# Patient Record
Sex: Female | Born: 1955
Health system: Southern US, Community
[De-identification: ages and names within clinical notes are randomized; demographics above are authoritative.]

## PROBLEM LIST (undated history)

## (undated) DIAGNOSIS — N952 Postmenopausal atrophic vaginitis: Secondary | ICD-10-CM

## (undated) DIAGNOSIS — M810 Age-related osteoporosis without current pathological fracture: Secondary | ICD-10-CM

## (undated) DIAGNOSIS — I10 Essential (primary) hypertension: Secondary | ICD-10-CM

## (undated) DIAGNOSIS — E559 Vitamin D deficiency, unspecified: Secondary | ICD-10-CM

## (undated) HISTORY — DX: Age-related osteoporosis without current pathological fracture: M81.0

## (undated) HISTORY — DX: Essential (primary) hypertension: I10

## (undated) HISTORY — PX: WISDOM TOOTH EXTRACTION: SHX21

## (undated) HISTORY — DX: Vitamin D deficiency, unspecified: E55.9

## (undated) HISTORY — DX: Postmenopausal atrophic vaginitis: N95.2

---

## 2000-05-27 ENCOUNTER — Other Ambulatory Visit: Admission: RE | Admit: 2000-05-27 | Discharge: 2000-05-27 | Payer: Self-pay | Admitting: Internal Medicine

## 2003-10-16 ENCOUNTER — Other Ambulatory Visit: Admission: RE | Admit: 2003-10-16 | Discharge: 2003-10-16 | Payer: Self-pay | Admitting: Family Medicine

## 2003-10-26 ENCOUNTER — Encounter: Admission: RE | Admit: 2003-10-26 | Discharge: 2003-10-26 | Payer: Self-pay | Admitting: Family Medicine

## 2003-10-26 ENCOUNTER — Ambulatory Visit (HOSPITAL_COMMUNITY): Admission: RE | Admit: 2003-10-26 | Discharge: 2003-10-26 | Payer: Self-pay | Admitting: Family Medicine

## 2004-10-14 ENCOUNTER — Ambulatory Visit: Payer: Self-pay | Admitting: Family Medicine

## 2004-10-28 ENCOUNTER — Ambulatory Visit: Payer: Self-pay | Admitting: Family Medicine

## 2004-11-07 ENCOUNTER — Ambulatory Visit: Payer: Self-pay | Admitting: Family Medicine

## 2004-11-29 ENCOUNTER — Encounter: Admission: RE | Admit: 2004-11-29 | Discharge: 2004-11-29 | Payer: Self-pay | Admitting: Family Medicine

## 2005-11-04 ENCOUNTER — Other Ambulatory Visit: Admission: RE | Admit: 2005-11-04 | Discharge: 2005-11-04 | Payer: Self-pay | Admitting: Obstetrics & Gynecology

## 2006-01-13 ENCOUNTER — Ambulatory Visit (HOSPITAL_COMMUNITY): Admission: RE | Admit: 2006-01-13 | Discharge: 2006-01-13 | Payer: Self-pay | Admitting: Internal Medicine

## 2007-01-22 ENCOUNTER — Ambulatory Visit (HOSPITAL_COMMUNITY): Admission: RE | Admit: 2007-01-22 | Discharge: 2007-01-22 | Payer: Self-pay | Admitting: Internal Medicine

## 2007-03-01 ENCOUNTER — Other Ambulatory Visit: Admission: RE | Admit: 2007-03-01 | Discharge: 2007-03-01 | Payer: Self-pay | Admitting: Obstetrics & Gynecology

## 2008-01-24 ENCOUNTER — Ambulatory Visit (HOSPITAL_COMMUNITY): Admission: RE | Admit: 2008-01-24 | Discharge: 2008-01-24 | Payer: Self-pay | Admitting: Internal Medicine

## 2008-02-03 ENCOUNTER — Encounter: Admission: RE | Admit: 2008-02-03 | Discharge: 2008-02-03 | Payer: Self-pay | Admitting: Internal Medicine

## 2008-04-07 ENCOUNTER — Other Ambulatory Visit: Admission: RE | Admit: 2008-04-07 | Discharge: 2008-04-07 | Payer: Self-pay | Admitting: Obstetrics and Gynecology

## 2008-05-02 ENCOUNTER — Other Ambulatory Visit: Admission: RE | Admit: 2008-05-02 | Discharge: 2008-05-02 | Payer: Self-pay | Admitting: Obstetrics & Gynecology

## 2009-02-07 ENCOUNTER — Ambulatory Visit (HOSPITAL_COMMUNITY): Admission: RE | Admit: 2009-02-07 | Discharge: 2009-02-07 | Payer: Self-pay | Admitting: Internal Medicine

## 2010-02-21 ENCOUNTER — Ambulatory Visit (HOSPITAL_COMMUNITY): Admission: RE | Admit: 2010-02-21 | Discharge: 2010-02-21 | Payer: Self-pay | Admitting: Internal Medicine

## 2010-07-15 LAB — HM COLONOSCOPY

## 2010-09-25 ENCOUNTER — Emergency Department (HOSPITAL_COMMUNITY)
Admission: EM | Admit: 2010-09-25 | Discharge: 2010-09-25 | Disposition: A | Discharge status: Home / Self Care | Payer: BC Managed Care – PPO | Attending: Emergency Medicine | Admitting: Emergency Medicine

## 2010-09-25 ENCOUNTER — Emergency Department (HOSPITAL_COMMUNITY): Payer: BC Managed Care – PPO

## 2010-09-25 DIAGNOSIS — R0789 Other chest pain: Secondary | ICD-10-CM | POA: Insufficient documentation

## 2010-09-25 DIAGNOSIS — I10 Essential (primary) hypertension: Secondary | ICD-10-CM | POA: Insufficient documentation

## 2010-09-25 DIAGNOSIS — M79609 Pain in unspecified limb: Secondary | ICD-10-CM | POA: Insufficient documentation

## 2010-09-25 LAB — DIFFERENTIAL
Basophils Absolute: 0 10*3/uL (ref 0.0–0.1)
Basophils Relative: 1 % (ref 0–1)
Eosinophils Absolute: 0.2 10*3/uL (ref 0.0–0.7)
Monocytes Absolute: 0.4 10*3/uL (ref 0.1–1.0)
Monocytes Relative: 9 % (ref 3–12)
Neutro Abs: 2.3 10*3/uL (ref 1.7–7.7)

## 2010-09-25 LAB — URINE MICROSCOPIC-ADD ON

## 2010-09-25 LAB — URINALYSIS, ROUTINE W REFLEX MICROSCOPIC
Ketones, ur: NEGATIVE mg/dL
Nitrite: NEGATIVE
Specific Gravity, Urine: 1.013 (ref 1.005–1.030)
Urobilinogen, UA: 0.2 mg/dL (ref 0.0–1.0)
pH: 8 (ref 5.0–8.0)

## 2010-09-25 LAB — POCT I-STAT, CHEM 8
Chloride: 103 mEq/L (ref 96–112)
Glucose, Bld: 94 mg/dL (ref 70–99)
HCT: 38 % (ref 36.0–46.0)
Hemoglobin: 12.9 g/dL (ref 12.0–15.0)
Potassium: 3.6 mEq/L (ref 3.5–5.1)

## 2010-09-25 LAB — POCT CARDIAC MARKERS
CKMB, poc: <1 ng/mL — ABNORMAL LOW (ref 1.0–8.0)
Troponin i, poc: <0.05 ng/mL (ref 0.00–0.09)

## 2010-09-25 LAB — CBC
MCH: 26 pg (ref 26.0–34.0)
MCHC: 32.4 g/dL (ref 30.0–36.0)
RBC: 4.61 MIL/uL (ref 3.87–5.11)
RDW: 13 % (ref 11.5–15.5)

## 2010-09-25 LAB — D-DIMER, QUANTITATIVE: D-Dimer, Quant: <0.22 ug/mL-FEU (ref 0.00–0.48)

## 2010-09-26 LAB — URINE CULTURE
Colony Count: NO GROWTH
Culture  Setup Time: 201203211652

## 2011-01-30 ENCOUNTER — Other Ambulatory Visit (HOSPITAL_COMMUNITY): Payer: Self-pay | Admitting: Internal Medicine

## 2011-01-30 DIAGNOSIS — Z1231 Encounter for screening mammogram for malignant neoplasm of breast: Secondary | ICD-10-CM

## 2011-03-04 ENCOUNTER — Ambulatory Visit (HOSPITAL_COMMUNITY)
Admission: RE | Admit: 2011-03-04 | Discharge: 2011-03-04 | Disposition: A | Discharge status: Home / Self Care | Payer: BC Managed Care – PPO | Source: Ambulatory Visit | Attending: Internal Medicine | Admitting: Internal Medicine

## 2011-03-04 DIAGNOSIS — Z1231 Encounter for screening mammogram for malignant neoplasm of breast: Secondary | ICD-10-CM

## 2012-03-04 ENCOUNTER — Other Ambulatory Visit (HOSPITAL_BASED_OUTPATIENT_CLINIC_OR_DEPARTMENT_OTHER): Payer: Self-pay | Admitting: Internal Medicine

## 2012-03-04 DIAGNOSIS — Z1231 Encounter for screening mammogram for malignant neoplasm of breast: Secondary | ICD-10-CM

## 2012-03-15 ENCOUNTER — Ambulatory Visit (HOSPITAL_BASED_OUTPATIENT_CLINIC_OR_DEPARTMENT_OTHER)
Admission: RE | Admit: 2012-03-15 | Discharge: 2012-03-15 | Disposition: A | Discharge status: Home / Self Care | Payer: BC Managed Care – PPO | Source: Ambulatory Visit | Attending: Internal Medicine | Admitting: Internal Medicine

## 2012-03-15 DIAGNOSIS — Z1231 Encounter for screening mammogram for malignant neoplasm of breast: Secondary | ICD-10-CM | POA: Insufficient documentation

## 2012-07-14 LAB — HM PAP SMEAR: HM PAP: NEGATIVE

## 2013-03-04 ENCOUNTER — Other Ambulatory Visit (HOSPITAL_BASED_OUTPATIENT_CLINIC_OR_DEPARTMENT_OTHER): Payer: Self-pay | Admitting: Internal Medicine

## 2013-03-04 DIAGNOSIS — Z1231 Encounter for screening mammogram for malignant neoplasm of breast: Secondary | ICD-10-CM

## 2013-03-16 ENCOUNTER — Ambulatory Visit (HOSPITAL_BASED_OUTPATIENT_CLINIC_OR_DEPARTMENT_OTHER)
Admission: RE | Admit: 2013-03-16 | Discharge: 2013-03-16 | Disposition: A | Discharge status: Home / Self Care | Payer: BC Managed Care – PPO | Source: Ambulatory Visit | Attending: Internal Medicine | Admitting: Internal Medicine

## 2013-03-16 DIAGNOSIS — Z1231 Encounter for screening mammogram for malignant neoplasm of breast: Secondary | ICD-10-CM | POA: Insufficient documentation

## 2013-08-01 ENCOUNTER — Encounter: Payer: Self-pay | Admitting: Nurse Practitioner

## 2013-08-02 ENCOUNTER — Encounter: Payer: Self-pay | Admitting: Nurse Practitioner

## 2013-08-02 ENCOUNTER — Ambulatory Visit (INDEPENDENT_AMBULATORY_CARE_PROVIDER_SITE_OTHER): Payer: BC Managed Care – PPO | Admitting: Nurse Practitioner

## 2013-08-02 VITALS — BP 102/64 | HR 64 | Ht 63.25 in | Wt 107.0 lb

## 2013-08-02 DIAGNOSIS — Z01419 Encounter for gynecological examination (general) (routine) without abnormal findings: Secondary | ICD-10-CM

## 2013-08-02 MED ORDER — ESTRADIOL 10 MCG VA TABS: 1.0000 | ORAL_TABLET | Freq: Every day | VAGINAL | Status: DC

## 2013-08-02 MED ORDER — ERGOCALCIFEROL 1.25 MG (50000 UT) PO CAPS: 50000.0000 [IU] | ORAL_CAPSULE | ORAL | Status: DC

## 2013-08-02 NOTE — Patient Instructions (Signed)

## 2013-08-02 NOTE — Progress Notes (Signed)
Patient ID: Sonya Joyce, female   DOB: 11/10/1955, 58 y.o.   MRN: [id number] 58 y.o. G0P0 Married Caucasian Fe here for annual exam.  No vaso symptoms. No vaginal symptoms and has good bladder control.  No LMP recorded. Patient is postmenopausal.          Sexually active: no  The current method of family planning is none.    Exercising: yes  Gym/ health club routine includes mod to heavy weightlifting and elliptical 6 days per week. Smoker:  no  Health Maintenance: Pap:  07/14/12, WNL, neg HR HPV MMG:  03/16/13, Bi-Rads 1: negative Colonoscopy:  07/15/10, chronic active colitis, tubular adenoma, recheck 5 years BMD:  2013, repeat scheduled for 09/2013, PCP follows TDaP:  03/01/07 Shingles 07/29/13 Labs:  PCP Vit D last week results were 59, takes Vit D every other week   reports that she has never smoked. She has never used smokeless tobacco. She reports that she drinks about 1.5 ounces of alcohol per week. She reports that she does not use illicit drugs.  Past Medical History  Diagnosis Date  . Hypertension   . Vitamin D deficiency   . Atrophic vaginitis     Past Surgical History  Procedure Laterality Date  . Wisdom tooth extraction  age 58    Current Outpatient Prescriptions  Medication Sig Dispense Refill  . ALPRAZolam (XANAX) 0.5 MG tablet Take 1 tablet by mouth as needed.      . Calcium Carbonate-Vit D-Min (CALCIUM 1200 PO) Take 1,200 mg by mouth daily.      . ergocalciferol (VITAMIN D2) 50000 UNITS capsule Take 1 capsule (50,000 Units total) by mouth once a week.  30 capsule  3  . Estradiol (VAGIFEM) 10 MCG TABS vaginal tablet Place 1 tablet (10 mcg total) vaginally daily.  24 tablet  3  . losartan-hydrochlorothiazide (HYZAAR) 100-25 MG per tablet Take 1 tablet by mouth daily.       No current facility-administered medications for this visit.    Family History  Problem Relation Age of Onset  . Hepatitis C Mother   . Hypertension Mother   . Prostate cancer Father 265  .  Hypertension Father   . Multiple births Brother     has a set of twins  . Hypertension Brother     ROS:  Pertinent items are noted in HPI.  Otherwise, a comprehensive ROS was negative.  Exam:   BP 102/64  Pulse 64  Ht 5' 3.25" (1.607 m)  Wt 107 lb (48.535 kg)  BMI 18.79 kg/m2 Height: 5' 3.25" (160.7 cm)  Ht Readings from Last 3 Encounters:  08/02/13 5' 3.25" (1.607 m)    General appearance: alert, cooperative and appears stated age Head: Normocephalic, without obvious abnormality, atraumatic Neck: no adenopathy, supple, symmetrical, trachea midline and thyroid normal to inspection and palpation Lungs: clear to auscultation bilaterally Breasts: normal appearance, no masses or tenderness Heart: regular rate and rhythm Abdomen: soft, non-tender; no masses,  no organomegaly Extremities: extremities normal, atraumatic, no cyanosis or edema Skin: Skin color, texture, turgor normal. No rashes or lesions Lymph nodes: Cervical, supraclavicular, and axillary nodes normal. No abnormal inguinal nodes palpated Neurologic: Grossly normal   Pelvic: External genitalia:  no lesions              Urethra:  normal appearing urethra with no masses, tenderness or lesions              Bartholin's and Skene's: normal  Vagina: normal appearing vagina with normal color and discharge, no lesions              Cervix: anteverted              Pap taken: no Bimanual Exam:  Uterus:  normal size, contour, position, consistency, mobility, non-tender              Adnexa: no mass, fullness, tenderness               Rectovaginal: Confirms               Anus:  normal sphincter tone, no lesions  A:  Well Woman with normal exam  Postmenopausal  Atrophic Vaginitis  P:   Pap smear as per guidelines   Mammogram due 9/15  Refill on Vit D and Vagifem for a year  Counseled with risk of DVT, CVA, cancer, etc  Counseled on breast self exam, mammography screening, adequate intake of calcium and  vitamin D, diet and exercise, Kegel's exercises return annually or prn  An After Visit Summary was printed and given to the patient.

## 2013-08-03 NOTE — Progress Notes (Signed)
Reviewed personally.  M. Suzanne Avid Guillette, MD.  

## 2013-08-04 ENCOUNTER — Other Ambulatory Visit: Payer: Self-pay | Admitting: *Deleted

## 2013-08-04 ENCOUNTER — Ambulatory Visit: Payer: Self-pay | Admitting: Nurse Practitioner

## 2013-08-04 MED ORDER — ESTRADIOL 10 MCG VA TABS: 1.0000 | ORAL_TABLET | Freq: Every day | VAGINAL | Status: DC

## 2013-08-04 NOTE — Telephone Encounter (Signed)
Incoming fax requesting 90 day supply for Vagifem.   Rx sent for 90 days and 3 refills - Per Ms Patty's note 08/02/2013.

## 2013-08-05 ENCOUNTER — Telehealth: Payer: Self-pay | Admitting: Nurse Practitioner

## 2013-08-05 MED ORDER — ESTRADIOL 10 MCG VA TABS: ORAL_TABLET | VAGINAL | Status: DC

## 2013-08-05 NOTE — Telephone Encounter (Signed)
Need to verify directions on  Estradiol (VAGIFEM) 10 MCG TABS vaginal tablet  Place 1 tablet (10 mcg total) vaginally daily., Starting 08/04/2013, Until Discontinued, Normal, Last Dose: Not Recorded  Refills: 3 ordered Pharmacy: [facility] - [place], [place] - [address]

## 2013-08-05 NOTE — Telephone Encounter (Signed)
Pharmacy calling to clarify orders for Vagifem. Advised 1 tablet 10 mcg 2 times per week. Dispense 24 with 3 refills, 90 day supply.  Confirmed with Lauro FranklinPatricia Rolen-Grubb, FNP, correct.  Routing to provider for final review. Patient agreeable to disposition. Will close encounter

## 2013-10-15 ENCOUNTER — Ambulatory Visit: Payer: BC Managed Care – PPO | Admitting: Family Medicine

## 2013-10-15 VITALS — BP 132/82 | HR 67 | Temp 98.1°F | Resp 18 | Ht 62.5 in | Wt 102.0 lb

## 2013-10-15 DIAGNOSIS — R300 Dysuria: Secondary | ICD-10-CM

## 2013-10-15 DIAGNOSIS — R3129 Other microscopic hematuria: Secondary | ICD-10-CM

## 2013-10-15 DIAGNOSIS — N390 Urinary tract infection, site not specified: Secondary | ICD-10-CM

## 2013-10-15 LAB — POCT URINALYSIS DIPSTICK
Bilirubin, UA: NEGATIVE
GLUCOSE UA: NEGATIVE
Ketones, UA: 40
NITRITE UA: NEGATIVE
Protein, UA: 30
UROBILINOGEN UA: 0.2
pH, UA: 5.5

## 2013-10-15 LAB — POCT UA - MICROSCOPIC ONLY
Casts, Ur, LPF, POC: NEGATIVE
Crystals, Ur, HPF, POC: NEGATIVE
Epithelial cells, urine per micros: NEGATIVE
Mucus, UA: POSITIVE
Yeast, UA: NEGATIVE

## 2013-10-15 MED ORDER — CEPHALEXIN 500 MG PO CAPS: 500.0000 mg | ORAL_CAPSULE | Freq: Two times a day (BID) | ORAL | Status: DC

## 2013-10-15 NOTE — Patient Instructions (Signed)
Let me know if you are not feeling better in the next day or tow.  Use the keflex as directed for UTI.  I will send your urine for a culture and let you know if there is any problem.  You can use OTC pyrudium (azo is one brand name) as needed for symptoms.

## 2013-10-15 NOTE — Progress Notes (Addendum)
[facility] [address], [place] [address] [phone]  Date:  10/15/2013   Name:  Sonya Joyce   DOB:  01/10/1956   MRN:  [id number]  PCP:  Martha ClanShaw, William, MD    Chief Complaint: Urinary Tract Infection   History of Present Illness:  Sonya Joyce is a 58 y.o. very pleasant female patient who presents with the following:  Here today with likely UTI.  History of HTN but otherwises generally healthy.  She is allergic to sulfa, post- menopausal  She has had a UTI in the past- her sx started just about an hour ago.   She has noted urinary frequency, dysuria. No blood in her uriene.  No abdominal pain, no nausea, vomiting or fever, no vaginal sx.    There are no active problems to display for this patient.   Past Medical History  Diagnosis Date  . Hypertension   . Vitamin D deficiency   . Atrophic vaginitis     Past Surgical History  Procedure Laterality Date  . Wisdom tooth extraction  age 89    History  Substance Use Topics  . Smoking status: Never Smoker   . Smokeless tobacco: Never Used  . Alcohol Use: 1.5 oz/week    3 drink(s) per week    Family History  Problem Relation Age of Onset  . Hepatitis C Mother   . Hypertension Mother   . Prostate cancer Father [phone]  . Hypertension Father   . Multiple births Brother     has a set of twins  . Hypertension Brother     Allergies  Allergen Reactions  . Sulfa Antibiotics Rash    Medication list has been reviewed and updated.  Current Outpatient Prescriptions on File Prior to Visit  Medication Sig Dispense Refill  . ALPRAZolam (XANAX) 0.5 MG tablet Take 1 tablet by mouth as needed.      . Calcium Carbonate-Vit D-Min (CALCIUM 1200 PO) Take 1,200 mg by mouth daily.      . ergocalciferol (VITAMIN D2) 50000 UNITS capsule Take 1 capsule (50,000 Units total) by mouth once a week.  30 capsule  3  . Estradiol (VAGIFEM) 10 MCG TABS vaginal tablet 1 tablet to vagina two times per week.  24  tablet  3  . losartan-hydrochlorothiazide (HYZAAR) 100-25 MG per tablet Take 1 tablet by mouth daily.       No current facility-administered medications on file prior to visit.    Review of Systems:  As per HPI- otherwise negative.   Physical Examination: Filed Vitals:   10/15/13 1814  BP: 132/82  Pulse: 67  Temp: 98.1 F (36.7 C)  Resp: 18   Filed Vitals:   10/15/13 1814  Height: 5' 2.5" (1.588 m)  Weight: 102 lb (46.267 kg)   Body mass index is 18.35 kg/(m^2). Ideal Body Weight: Weight in (lb) to have BMI = 25: 138.6  GEN: WDWN, NAD, Non-toxic, A & O x 3 HEENT: Atraumatic, Normocephalic. Neck supple. No masses, No LAD. Ears and Nose: No external deformity. CV: RRR, No M/G/R. No JVD. No thrill. No extra heart sounds. PULM: CTA B, no wheezes, crackles, rhonchi. No retractions. No resp. distress. No accessory muscle use. ABD: S, NT, ND, +BS. No rebound. No HSM. EXTR: No c/c/e NEURO Normal gait.  PSYCH: Normally interactive. Conversant. Not depressed or anxious appearing.  Calm demeanor.   Results for orders placed in visit on 10/15/13  POCT URINALYSIS DIPSTICK  Result Value Ref Range   Color, UA yellow     Clarity, UA cloudy     Glucose, UA neg     Bilirubin, UA neg     Ketones, UA 40     Spec Grav, UA >=1.030     Blood, UA Joyce     pH, UA 5.5     Protein, UA 30     Urobilinogen, UA 0.2     Nitrite, UA neg     Leukocytes, UA Joyce (3+)    POCT UA - MICROSCOPIC ONLY      Result Value Ref Range   WBC, Ur, HPF, POC 4-TNTC     RBC, urine, microscopic 4-13     Bacteria, U Microscopic trace     Mucus, UA positive     Epithelial cells, urine per micros neg     Crystals, Ur, HPF, POC neg     Casts, Ur, LPF, POC neg     Yeast, UA neg       Assessment and Plan: Dysuria - Plan: POCT urinalysis dipstick, POCT UA - Microscopic Only, Urine culture, CANCELED: Wound culture  UTI (urinary tract infection) - Plan: cephALEXin (KEFLEX) 500 MG capsule  Treat for  UTI as above.  Close follow-up if not better, await culture  Signed Abbe Amsterdam, MD  Called on 4/14: received urine culture which is inconclusive.  Called her- she feels better.  Explained that her culture is inconclusive and that this leaves [place] with unexplained microhematuria. Likely is due to an infection but cannot rule out a more serious cause.  She would like to go ahead and see urology- will take care of this for her.

## 2013-10-17 LAB — URINE CULTURE

## 2013-10-18 ENCOUNTER — Encounter: Payer: Self-pay | Admitting: Family Medicine

## 2013-10-18 ENCOUNTER — Telehealth: Payer: Self-pay | Admitting: Family Medicine

## 2013-10-18 NOTE — Telephone Encounter (Signed)
Error

## 2013-10-18 NOTE — Addendum Note (Signed)
Addended by: Abbe AmsterdamOPLAND, Zyon Rosser C on: 10/18/2013 03:12 PM   Modules accepted: Orders

## 2014-02-07 ENCOUNTER — Other Ambulatory Visit (HOSPITAL_BASED_OUTPATIENT_CLINIC_OR_DEPARTMENT_OTHER): Payer: Self-pay | Admitting: Internal Medicine

## 2014-02-07 DIAGNOSIS — Z1231 Encounter for screening mammogram for malignant neoplasm of breast: Secondary | ICD-10-CM

## 2014-03-22 ENCOUNTER — Ambulatory Visit (HOSPITAL_BASED_OUTPATIENT_CLINIC_OR_DEPARTMENT_OTHER)
Admission: RE | Admit: 2014-03-22 | Discharge: 2014-03-22 | Disposition: A | Discharge status: Home / Self Care | Payer: BC Managed Care – PPO | Source: Ambulatory Visit | Attending: Internal Medicine | Admitting: Internal Medicine

## 2014-03-22 DIAGNOSIS — Z1231 Encounter for screening mammogram for malignant neoplasm of breast: Secondary | ICD-10-CM | POA: Diagnosis not present

## 2014-08-08 ENCOUNTER — Ambulatory Visit (INDEPENDENT_AMBULATORY_CARE_PROVIDER_SITE_OTHER): Payer: BLUE CROSS/BLUE SHIELD | Admitting: Nurse Practitioner

## 2014-08-08 ENCOUNTER — Encounter: Payer: Self-pay | Admitting: Nurse Practitioner

## 2014-08-08 VITALS — BP 124/82 | HR 64 | Ht 63.0 in | Wt 110.0 lb

## 2014-08-08 DIAGNOSIS — Z01419 Encounter for gynecological examination (general) (routine) without abnormal findings: Secondary | ICD-10-CM

## 2014-08-08 DIAGNOSIS — Z0000 Encounter for general adult medical examination without abnormal findings: Secondary | ICD-10-CM

## 2014-08-08 LAB — POCT URINALYSIS DIPSTICK
BILIRUBIN UA: NEGATIVE
GLUCOSE UA: NEGATIVE
KETONES UA: NEGATIVE
LEUKOCYTES UA: NEGATIVE
NITRITE UA: NEGATIVE
PH UA: 5
PROTEIN UA: NEGATIVE
RBC UA: NEGATIVE
Urobilinogen, UA: NEGATIVE

## 2014-08-08 MED ORDER — ERGOCALCIFEROL 1.25 MG (50000 UT) PO CAPS: 50000.0000 [IU] | ORAL_CAPSULE | ORAL | Status: DC

## 2014-08-08 NOTE — Patient Instructions (Signed)

## 2014-08-08 NOTE — Progress Notes (Addendum)
Patient ID: Sonya Joyce, female   DOB: 11/14/1955, 59 y.o.   MRN: [id number] 59 y.o. G0P0 Married  Caucasian Fe here for annual exam.  Still some vaginal dryness and does help with use of Vagifem - cost is so high.  On Vit D now monthly per PCP. Last March was diagnosed with Osteoporosis and now on Fosamax.  Father has the same.   He is now 3589 and still doing well.   Patient's last menstrual period was 09/04/2005 (approximate).          Sexually active: No.  The current method of family planning is none.    Exercising: Yes.    elliptical, walking and weights Smoker:  no  Health Maintenance: Pap:  07/14/12, negative with neg HR HPV MMG:  03/22/14, Bi-Rads 1:  Negative  Colonoscopy:  07/15/10, chronic active colitis, tubular adenoma, recheck 5 years BMD:   2015, followed by PCP.  Fosamax since  09/2013 TDaP:  03/01/07 Shingles:  07/29/13 Labs:  Last week at PCP.  PCP also checked Vit D ?60  Urine:  Negative    reports that she has never smoked. She has never used smokeless tobacco. She reports that she drinks about 1.5 oz of alcohol per week. She reports that she does not use illicit drugs.  Past Medical History  Diagnosis Date  . Hypertension   . Vitamin D deficiency   . Atrophic vaginitis     Past Surgical History  Procedure Laterality Date  . Wisdom tooth extraction  age 89    Current Outpatient Prescriptions  Medication Sig Dispense Refill  . alendronate (FOSAMAX) 70 MG tablet Take 1 tablet by mouth once a week.    . ALPRAZolam (XANAX) 0.5 MG tablet Take 1 tablet by mouth as needed.    . Calcium Carbonate-Vit D-Min (CALCIUM 1200 PO) Take 1,200 mg by mouth daily.    . ergocalciferol (VITAMIN D2) 50000 UNITS capsule Take 1 capsule (50,000 Units total) by mouth once a week. 30 capsule 0  . escitalopram (LEXAPRO) 10 MG tablet Take 1 tablet by mouth daily.    . Estradiol (VAGIFEM) 10 MCG TABS vaginal tablet 1 tablet to vagina two times per week. 24 tablet 3  .  losartan-hydrochlorothiazide (HYZAAR) 100-12.5 MG per tablet Take 1 tablet by mouth daily.     No current facility-administered medications for this visit.    Family History  Problem Relation Age of Onset  . Hepatitis C Mother   . Hypertension Mother   . Prostate cancer Father [id number]  . Hypertension Father   . Multiple births Brother     has a set of twins  . Hypertension Brother     ROS:  Pertinent items are noted in HPI.  Otherwise, a comprehensive ROS was negative.  Exam:   BP 124/82 mmHg  Pulse 64  Ht 5\' 3"  (1.6 m)  Wt 110 lb (49.896 kg)  BMI 19.49 kg/m2  LMP 09/04/2005 (Approximate) Height: 5\' 3"  (160 cm) Ht Readings from Last 3 Encounters:  08/08/14 5\' 3"  (1.6 m)  10/15/13 5' 2.5" (1.588 m)  08/02/13 5' 3.25" (1.607 m)    General appearance: alert, cooperative and appears stated age Head: Normocephalic, without obvious abnormality, atraumatic Neck: no adenopathy, supple, symmetrical, trachea midline and thyroid normal to inspection and palpation Lungs: clear to auscultation bilaterally Breasts: normal appearance, no masses or tenderness Heart: regular rate and rhythm Abdomen: soft, non-tender; no masses,  no organomegaly Extremities: extremities normal, atraumatic, no cyanosis or edema, recent  cut of right 5th finger with decrease ROM and most likely tendon damage X 2 months ago.. Skin: Skin color, texture, turgor normal. No rashes or lesions Lymph nodes: Cervical, supraclavicular, and axillary nodes normal. No abnormal inguinal nodes palpated Neurologic: Grossly normal   Pelvic: External genitalia:  no lesions              Urethra:  normal appearing urethra with no masses, tenderness or lesions              Bartholin's and Skene's: normal                 Vagina: normal appearing vagina with normal color and discharge, no lesions              Cervix: anteverted              Pap taken: No. Bimanual Exam:  Uterus:  normal size, contour, position, consistency,  mobility, non-tender              Adnexa: no mass, fullness, tenderness               Rectovaginal: Confirms               Anus:  normal sphincter tone, no lesions  Chaperone present:  no   A:  Well Woman with normal exam  Postmenopausal Atrophic Vaginitis better with Vagifem  Vit D deficiency  Situational stressors, HTN  Osteoporosis - now on Fosamax since 3/15 - monitored by PCP  P:   Reviewed health and wellness pertinent to exam  Pap smear not taken today  Mammogram is due 9/16  Refill on Vagifem for a year order entered later.  Counseled on breast self exam, mammography screening, use and side effects of HRT, osteoporosis, adequate intake of calcium and vitamin D, diet and exercise, Kegel's exercises return annually or prn  An After Visit Summary was printed and given to the patient.

## 2014-08-13 NOTE — Progress Notes (Signed)
Encounter reviewed by Dr. Ying Blankenhorn Silva.  

## 2014-08-14 ENCOUNTER — Other Ambulatory Visit: Payer: Self-pay | Admitting: *Deleted

## 2014-08-14 MED ORDER — ESTRADIOL 10 MCG VA TABS: ORAL_TABLET | VAGINAL | Status: DC

## 2014-08-14 NOTE — Telephone Encounter (Signed)
Incoming fax from [facility] requesting Vagifem 10 mcg  Medication refill request: Vagifem Last AEX:  08/08/14 Next AEX: 08/14/15 Last MMG (if hormonal medication request): 03/23/14 [id number]:Neg Refill authorized: 08/05/13 #24/3R. Today #24/3R?

## 2015-05-07 ENCOUNTER — Other Ambulatory Visit (HOSPITAL_BASED_OUTPATIENT_CLINIC_OR_DEPARTMENT_OTHER): Payer: Self-pay | Admitting: Internal Medicine

## 2015-05-07 DIAGNOSIS — Z1231 Encounter for screening mammogram for malignant neoplasm of breast: Secondary | ICD-10-CM

## 2015-05-21 ENCOUNTER — Ambulatory Visit (HOSPITAL_BASED_OUTPATIENT_CLINIC_OR_DEPARTMENT_OTHER)
Admission: RE | Admit: 2015-05-21 | Discharge: 2015-05-21 | Disposition: A | Discharge status: Home / Self Care | Payer: BLUE CROSS/BLUE SHIELD | Source: Ambulatory Visit | Attending: Internal Medicine | Admitting: Internal Medicine

## 2015-05-21 DIAGNOSIS — Z1231 Encounter for screening mammogram for malignant neoplasm of breast: Secondary | ICD-10-CM | POA: Insufficient documentation

## 2015-06-19 ENCOUNTER — Other Ambulatory Visit: Payer: Self-pay | Admitting: *Deleted

## 2015-06-19 MED ORDER — ESTRADIOL 10 MCG VA TABS: ORAL_TABLET | VAGINAL | Status: DC

## 2015-06-19 NOTE — Telephone Encounter (Signed)
Medication refill request: Vagifem Last AEX:  08-08-14 Next AEX: 08-14-15 Last MMG (if hormonal medication request): 05-22-15 WNL Refill authorized: please advise

## 2015-08-14 ENCOUNTER — Ambulatory Visit (INDEPENDENT_AMBULATORY_CARE_PROVIDER_SITE_OTHER): Payer: BLUE CROSS/BLUE SHIELD | Admitting: Nurse Practitioner

## 2015-08-14 ENCOUNTER — Encounter: Payer: Self-pay | Admitting: Nurse Practitioner

## 2015-08-14 VITALS — BP 128/76 | HR 72 | Ht 63.25 in | Wt 112.0 lb

## 2015-08-14 DIAGNOSIS — Z0000 Encounter for general adult medical examination without abnormal findings: Secondary | ICD-10-CM | POA: Diagnosis not present

## 2015-08-14 DIAGNOSIS — Z01419 Encounter for gynecological examination (general) (routine) without abnormal findings: Secondary | ICD-10-CM | POA: Diagnosis not present

## 2015-08-14 MED ORDER — ERGOCALCIFEROL 1.25 MG (50000 UT) PO CAPS: 50000.0000 [IU] | ORAL_CAPSULE | ORAL | Status: DC

## 2015-08-14 MED ORDER — ESTRADIOL 10 MCG VA TABS: ORAL_TABLET | VAGINAL | Status: DC

## 2015-08-14 NOTE — Patient Instructions (Addendum)

## 2015-08-14 NOTE — Progress Notes (Signed)
Patient ID: Sonya Joyce, female   DOB: March 18, 1956, 60 y.o.   MRN: [id number]  60 y.o. G0P0 Married  Caucasian Fe here for annual exam.  No new diagnosis.  No  Vaso symptoms.  Using generic Vagifem and doing well.  She did have several UTI this past year - better now.    Patient's last menstrual period was 09/04/2005 (approximate).          Sexually active: No.  The current method of family planning is none.    Exercising: Yes.    walking elliptical and weights 5-6 times per week Smoker:  no  Health Maintenance: Pap:  07/14/12, Negative with neg HR HPV MMG: 05/21/15, 3D, Bi-Rads 1: Negative Colonoscopy:  07/15/10, chronic active colitis, tubular adenoma, repeat in 5 years BMD: 2015, followed by PCP. Fosamax since 09/2013, repeat scheduled in 09/2015 TDaP:  03/01/07 Shingles 07/29/13 Hep C and HIV: Hep C tested in 1998 due to family history, HIV discussed Labs: Dr. Clelia Croft takes care of all labs.   reports that she has never smoked. She has never used smokeless tobacco. She reports that she drinks about 1.5 oz of alcohol per week. She reports that she does not use illicit drugs.  Past Medical History  Diagnosis Date  . Hypertension   . Vitamin D deficiency   . Atrophic vaginitis     Past Surgical History  Procedure Laterality Date  . Wisdom tooth extraction  age 73    Current Outpatient Prescriptions  Medication Sig Dispense Refill  . alendronate (FOSAMAX) 70 MG tablet Take 1 tablet by mouth once a week.    . ALPRAZolam (XANAX) 0.5 MG tablet Take 1 tablet by mouth as needed.    . Calcium Carbonate-Vit D-Min (CALCIUM 1200 PO) Take 1,200 mg by mouth daily.    . ergocalciferol (VITAMIN D2) 50000 units capsule Take 1 capsule (50,000 Units total) by mouth once a week. 30 capsule 3  . escitalopram (LEXAPRO) 10 MG tablet Take 1 tablet by mouth daily.    . Estradiol (VAGIFEM) 10 MCG TABS vaginal tablet 1 tablet to vagina two times per week. 24 tablet 3  . losartan-hydrochlorothiazide  (HYZAAR) 100-12.5 MG per tablet Take 1 tablet by mouth daily.     No current facility-administered medications for this visit.    Family History  Problem Relation Age of Onset  . Hepatitis C Mother   . Hypertension Mother   . Prostate cancer Father 4  . Hypertension Father   . Multiple births Brother     has a set of twins  . Hypertension Brother     ROS:  Pertinent items are noted in HPI.  Otherwise, a comprehensive ROS was negative.  Exam:   BP 128/76 mmHg  Pulse 72  Ht 5' 3.25" (1.607 m)  Wt 112 lb (50.803 kg)  BMI 19.67 kg/m2  LMP 09/04/2005 (Approximate) Height: 5' 3.25" (160.7 cm) Ht Readings from Last 3 Encounters:  08/14/15 5' 3.25" (1.607 m)  08/08/14  (1.6 m)  10/15/13 5' 2.5" (1.588 m)    General appearance: alert, cooperative and appears stated age Head: Normocephalic, without obvious abnormality, atraumatic Neck: no adenopathy, supple, symmetrical, trachea midline and thyroid normal to inspection and palpation Lungs: clear to auscultation bilaterally Breasts: normal appearance, no masses or tenderness Heart: regular rate and rhythm Abdomen: soft, non-tender; no masses,  no organomegaly Extremities: extremities normal, atraumatic, no cyanosis or edema Skin: Skin color, texture, turgor normal. No rashes or lesions Lymph nodes: Cervical, supraclavicular,  and axillary nodes normal. No abnormal inguinal nodes palpated Neurologic: Grossly normal   Pelvic: External genitalia:  no lesions              Urethra:  normal appearing urethra with no masses, tenderness or lesions              Bartholin's and Skene's: normal                 Vagina: normal appearing vagina with normal color and discharge, no lesions              Cervix: anteverted              Pap taken: Yes.   Bimanual Exam:  Uterus:  normal size, contour, position, consistency, mobility, non-tender              Adnexa: no mass, fullness, tenderness               Rectovaginal: Confirms                Anus:  normal sphincter tone, no lesions  Chaperone present: yes  A:  Well Woman with normal exam  Postmenopausal Atrophic Vaginitis better with Vagifem Vit D deficiency Situational stressors, HTN Osteoporosis - now on Fosamax since 3/15 - monitored by PCP   P:   Reviewed health and wellness pertinent to exam  Pap smear as above  Mammogram is due 05/2016  Refill on Vagifem for a year - if she gets a recurrence of UTI - may want to consider vaginal estrogen cream to the urethra.  She will CB if needs to consider  Counseled with risk of  DVT, CVA, cancer, etc counseled on breast self exam, mammography screening, use and side effects of HRT, adequate intake of calcium and vitamin D, diet and exercise, Kegel's exercises return annually or prn  An After Visit Summary was printed and given to the patient.

## 2015-08-15 LAB — HIV ANTIBODY (ROUTINE TESTING W REFLEX): HIV: NONREACTIVE

## 2015-08-15 NOTE — Progress Notes (Signed)
Encounter reviewed by Dr. Janean Sark.  I would recommend rechecking a vitamin D level if this has not already been done with her PCP.

## 2015-08-16 ENCOUNTER — Other Ambulatory Visit: Payer: Self-pay | Admitting: *Deleted

## 2015-08-16 LAB — IPS PAP TEST WITH HPV

## 2015-08-16 MED ORDER — ERGOCALCIFEROL 1.25 MG (50000 UT) PO CAPS: 50000.0000 [IU] | ORAL_CAPSULE | ORAL | Status: DC

## 2016-04-24 ENCOUNTER — Other Ambulatory Visit (HOSPITAL_BASED_OUTPATIENT_CLINIC_OR_DEPARTMENT_OTHER): Payer: Self-pay | Admitting: Internal Medicine

## 2016-04-24 DIAGNOSIS — Z1231 Encounter for screening mammogram for malignant neoplasm of breast: Secondary | ICD-10-CM

## 2016-05-22 ENCOUNTER — Ambulatory Visit (HOSPITAL_BASED_OUTPATIENT_CLINIC_OR_DEPARTMENT_OTHER)
Admission: RE | Admit: 2016-05-22 | Discharge: 2016-05-22 | Disposition: A | Discharge status: Home / Self Care | Payer: BLUE CROSS/BLUE SHIELD | Source: Ambulatory Visit | Attending: Internal Medicine | Admitting: Internal Medicine

## 2016-05-22 DIAGNOSIS — Z1231 Encounter for screening mammogram for malignant neoplasm of breast: Secondary | ICD-10-CM | POA: Insufficient documentation

## 2016-08-18 ENCOUNTER — Ambulatory Visit: Payer: BLUE CROSS/BLUE SHIELD | Admitting: Nurse Practitioner

## 2016-08-25 ENCOUNTER — Ambulatory Visit (INDEPENDENT_AMBULATORY_CARE_PROVIDER_SITE_OTHER): Payer: BLUE CROSS/BLUE SHIELD | Admitting: Nurse Practitioner

## 2016-08-25 ENCOUNTER — Encounter: Payer: Self-pay | Admitting: Nurse Practitioner

## 2016-08-25 VITALS — BP 110/64 | HR 66 | Ht 63.0 in | Wt 114.0 lb

## 2016-08-25 DIAGNOSIS — Z23 Encounter for immunization: Secondary | ICD-10-CM | POA: Diagnosis not present

## 2016-08-25 DIAGNOSIS — M810 Age-related osteoporosis without current pathological fracture: Secondary | ICD-10-CM | POA: Diagnosis not present

## 2016-08-25 DIAGNOSIS — N952 Postmenopausal atrophic vaginitis: Secondary | ICD-10-CM | POA: Diagnosis not present

## 2016-08-25 DIAGNOSIS — E559 Vitamin D deficiency, unspecified: Secondary | ICD-10-CM

## 2016-08-25 DIAGNOSIS — Z0000 Encounter for general adult medical examination without abnormal findings: Secondary | ICD-10-CM | POA: Diagnosis not present

## 2016-08-25 DIAGNOSIS — Z01411 Encounter for gynecological examination (general) (routine) with abnormal findings: Secondary | ICD-10-CM | POA: Diagnosis not present

## 2016-08-25 MED ORDER — ESTRADIOL 10 MCG VA TABS: ORAL_TABLET | VAGINAL | 3 refills | Status: DC

## 2016-08-25 NOTE — Progress Notes (Signed)
61 y.o. G0P0000 Married  Caucasian Fe here for annual exam.  No new health problems.  Likes Vagifem and uses 1-2 times a week.  Took Cipro in January for abnormal urine - has a rash on upper arm and leg that may / not be related.  Patient's last menstrual period was 09/04/2005 (approximate).          Sexually active: No.  The current method of family planning is post menopausal status.    Exercising: Yes.    Gym/ health club routine includes cardio and light weights. Smoker:  no  Health Maintenance: Pap: 27/17, Negative with neg HR HPV  07/14/12, Negative with neg HR HPV MMG: 05/22/16, 3D, Bi-Rads 1: Negative Colonoscopy: 11/2015, normal, repeat in 10 years BMD: 09/2015, improved from 2015, on Fosamax since 2015 (done at PCP) TDaP: 03/01/07 Shingles: 07/29/13 Hep C: tested in 1998 due to family history HIV: 08/14/15 Labs: Dr. Clelia Croft takes care of all labs   reports that she has never smoked. She has never used smokeless tobacco. She reports that she drinks about 1.5 oz of alcohol per week . She reports that she does not use drugs.  Past Medical History:  Diagnosis Date  . Atrophic vaginitis   . Hypertension   . Vitamin D deficiency     Past Surgical History:  Procedure Laterality Date  . WISDOM TOOTH EXTRACTION  age 69    Current Outpatient Prescriptions  Medication Sig Dispense Refill  . alendronate (FOSAMAX) 70 MG tablet Take 1 tablet by mouth once a week.    . ALPRAZolam (XANAX) 0.5 MG tablet Take 1 tablet by mouth as needed.    . ergocalciferol (VITAMIN D2) 50000 units capsule Take 1 capsule (50,000 Units total) by mouth every 30 (thirty) days. 30 capsule 3  . Estradiol (VAGIFEM) 10 MCG TABS vaginal tablet 1 tablet to vagina two times per week. 24 tablet 3  . losartan-hydrochlorothiazide (HYZAAR) 100-12.5 MG per tablet Take 1 tablet by mouth daily.     No current facility-administered medications for this visit.     Family History  Problem Relation Age of Onset  . Hepatitis C  Mother   . Hypertension Mother   . Prostate cancer Father 18  . Hypertension Father   . Multiple births Brother     has a set of twins  . Hypertension Brother     ROS:  Pertinent items are noted in HPI.  Otherwise, a comprehensive ROS was negative.  Exam:   BP 110/64 (BP Location: Right Arm, Patient Position: Sitting, Cuff Size: Normal)   Pulse 66   Ht 5\' 3"  (1.6 m)   Wt 114 lb (51.7 kg)   LMP 09/04/2005 (Approximate)   BMI 20.19 kg/m  Height: 5\' 3"  (160 cm) Ht Readings from Last 3 Encounters:  08/25/16 5\' 3"  (1.6 m)  08/14/15 5' 3.25" (1.607 m)  08/08/14 5\' 3"  (1.6 m)    General appearance: alert, cooperative and appears stated age Head: Normocephalic, without obvious abnormality, atraumatic Neck: no adenopathy, supple, symmetrical, trachea midline and thyroid normal to inspection and palpation Lungs: clear to auscultation bilaterally Breasts: normal appearance, no masses or tenderness Heart: regular rate and rhythm Abdomen: soft, non-tender; no masses,  no organomegaly Extremities: extremities normal, atraumatic, no cyanosis or edema Skin: Skin color, texture, turgor normal. No rashes or lesions, very faint rash upper inner arms. None on legs or trunk. Lymph nodes: Cervical, supraclavicular, and axillary nodes normal. No abnormal inguinal nodes palpated Neurologic: Grossly normal  Pelvic: External genitalia:  no lesions              Urethra:  normal appearing urethra with no masses, tenderness or lesions              Bartholin's and Skene's: normal                 Vagina: still atrophic appearing vagina with normal color and discharge, no lesions              Cervix: anteverted              Pap taken: No. Bimanual Exam:  Uterus:  normal size, contour, position, consistency, mobility, non-tender              Adnexa: no mass, fullness, tenderness               Rectovaginal: Confirms               Anus:  normal sphincter tone, no lesions  Chaperone present: yes  A:   Well Woman with normal exam  Postmenopausal - HRT Atrophic Vaginitis better with Vagifem Vit D deficiency Situational stressors, HTN Osteoporosis - now on Fosamax since 3/15 - monitored by PCP  Update immunization   P:   Reviewed health and wellness pertinent to exam  Pap smear not done  Mammogram is due 11/18  Refill on Vagifem for a year  Counseled with risk of DVT, CVA, cancer, etc.  Counseled on breast self exam, mammography screening, use and side effects of HRT, adequate intake of calcium and vitamin D, diet and exercise, Kegel's exercises return annually or prn  An After Visit Summary was printed and given to the patient.

## 2016-08-25 NOTE — Patient Instructions (Signed)

## 2016-08-25 NOTE — Progress Notes (Signed)
Encounter reviewed by Dr. Brook Amundson C. Silva.  

## 2017-01-30 ENCOUNTER — Telehealth: Payer: Self-pay | Admitting: Obstetrics and Gynecology

## 2017-01-30 NOTE — Telephone Encounter (Signed)
Left message on voicemail to call and reschedule cancelled appointment. Mail letter °

## 2017-04-16 ENCOUNTER — Other Ambulatory Visit (HOSPITAL_BASED_OUTPATIENT_CLINIC_OR_DEPARTMENT_OTHER): Payer: Self-pay | Admitting: Internal Medicine

## 2017-04-16 DIAGNOSIS — Z1231 Encounter for screening mammogram for malignant neoplasm of breast: Secondary | ICD-10-CM

## 2017-06-01 ENCOUNTER — Encounter (HOSPITAL_BASED_OUTPATIENT_CLINIC_OR_DEPARTMENT_OTHER): Payer: Self-pay

## 2017-06-01 ENCOUNTER — Ambulatory Visit (HOSPITAL_BASED_OUTPATIENT_CLINIC_OR_DEPARTMENT_OTHER)
Admission: RE | Admit: 2017-06-01 | Discharge: 2017-06-01 | Disposition: A | Discharge status: Home / Self Care | Payer: BLUE CROSS/BLUE SHIELD | Source: Ambulatory Visit | Attending: Internal Medicine | Admitting: Internal Medicine

## 2017-06-01 DIAGNOSIS — Z1231 Encounter for screening mammogram for malignant neoplasm of breast: Secondary | ICD-10-CM | POA: Insufficient documentation

## 2017-08-26 ENCOUNTER — Ambulatory Visit: Payer: BLUE CROSS/BLUE SHIELD | Admitting: Nurse Practitioner

## 2017-08-26 ENCOUNTER — Ambulatory Visit: Payer: BLUE CROSS/BLUE SHIELD | Admitting: Certified Nurse Midwife

## 2017-08-26 ENCOUNTER — Encounter: Payer: Self-pay | Admitting: Certified Nurse Midwife

## 2017-08-26 ENCOUNTER — Other Ambulatory Visit: Payer: Self-pay

## 2017-08-26 VITALS — BP 120/68 | HR 68 | Resp 20 | Ht 63.25 in | Wt 121.0 lb

## 2017-08-26 DIAGNOSIS — N952 Postmenopausal atrophic vaginitis: Secondary | ICD-10-CM | POA: Diagnosis not present

## 2017-08-26 DIAGNOSIS — Z01419 Encounter for gynecological examination (general) (routine) without abnormal findings: Secondary | ICD-10-CM

## 2017-08-26 DIAGNOSIS — Z8739 Personal history of other diseases of the musculoskeletal system and connective tissue: Secondary | ICD-10-CM

## 2017-08-26 MED ORDER — ESTRADIOL 10 MCG VA TABS: ORAL_TABLET | VAGINAL | 3 refills | Status: DC

## 2017-08-26 NOTE — Progress Notes (Signed)
62 y.o. G0P0000 Married  Caucasian Fe here for annual exam. Menopausal no HRT. Denies vaginal bleeding or vaginal dryness. Using Vagifem for vaginal dryness with good results. Sees PCP Dr. Clelia Croft for aex, labs, osteoporosis, hypertension, cholesterol, Vitamin D, all stable. Maybe stop Fosamax after BMD. Had previous issues with UTI and none since Vagifem use. No other health issues today.   Patient's last menstrual period was 09/04/2005 (approximate).          Sexually active: No.  The current method of family planning is post menopausal status.    Exercising: Yes.    strength, aerobic & walking Smoker:  no  Health Maintenance: Pap:  2/17 neg HPV HR neg History of Abnormal Pap: no MMG:  06-01-17 category c density birads 1:neg Self Breast exams: no Colonoscopy:  2017 f/u 89yrs BMD:   2017 will do with PCP TDaP:  2019 Shingles: 2015 Pneumonia: not done Hep C and HIV: HIV neg 2017 Labs: no   reports that  has never smoked. she has never used smokeless tobacco. She reports that she drinks about 3.0 oz of alcohol per week. She reports that she does not use drugs.  Past Medical History:  Diagnosis Date  . Atrophic vaginitis   . Hypertension   . Vitamin D deficiency     Past Surgical History:  Procedure Laterality Date  . WISDOM TOOTH EXTRACTION  age 62    Current Outpatient Medications  Medication Sig Dispense Refill  . alendronate (FOSAMAX) 70 MG tablet Take 1 tablet by mouth once a week.    . ALPRAZolam (XANAX) 0.5 MG tablet Take 1 tablet by mouth as needed.    . ergocalciferol (VITAMIN D2) 50000 units capsule Take 1 capsule (50,000 Units total) by mouth every 30 (thirty) days. (Patient taking differently: Take 50,000 Units by mouth once a week. ) 30 capsule 3  . escitalopram (LEXAPRO) 10 MG tablet     . Estradiol (VAGIFEM) 10 MCG TABS vaginal tablet 1 tablet to vagina two times per week. 24 tablet 3  . losartan-hydrochlorothiazide (HYZAAR) 100-12.5 MG per tablet Take 1 tablet  by mouth daily.    . simvastatin (ZOCOR) 20 MG tablet TK 1 T PO QPM AFTER DINNER  2   No current facility-administered medications for this visit.     Family History  Problem Relation Age of Onset  . Hepatitis C Mother   . Hypertension Mother   . Prostate cancer Father 6  . Hypertension Father   . Multiple births Brother        has a set of twins  . Hypertension Brother     ROS:  Pertinent items are noted in HPI.  Otherwise, a comprehensive ROS was negative.  Exam:   BP 120/68   Pulse 68   Resp 20   Ht 5' 3.25" (1.607 m)   Wt 121 lb (54.9 kg)   LMP 09/04/2005 (Approximate)   BMI 21.27 kg/m  Height: 5' 3.25" (160.7 cm) Ht Readings from Last 3 Encounters:  08/26/17 5' 3.25" (1.607 m)  08/25/16 5\' 3"  (1.6 m)  08/14/15 5' 3.25" (1.607 m)    General appearance: alert, cooperative and appears stated age Head: Normocephalic, without obvious abnormality, atraumatic Neck: no adenopathy, supple, symmetrical, trachea midline and thyroid normal to inspection and palpation Lungs: clear to auscultation bilaterally Breasts: normal appearance, no masses or tenderness, No nipple retraction or dimpling, No nipple discharge or bleeding, No axillary or supraclavicular adenopathy Heart: regular rate and rhythm Abdomen: soft, non-tender; no  masses,  no organomegaly Extremities: extremities normal, atraumatic, no cyanosis or edema Skin: Skin color, texture, turgor normal. No rashes or lesions Lymph nodes: Cervical, supraclavicular, and axillary nodes normal. No abnormal inguinal nodes palpated Neurologic: Grossly normal   Pelvic: External genitalia:  no lesions              Urethra:  normal appearing urethra with no masses, tenderness or lesions              Bartholin's and Skene's: normal                 Vagina: normal appearing vagina with normal color and discharge, no lesions              Cervix: no cervical motion tenderness and no lesions              Pap taken: No. Bimanual  Exam:  Uterus:  normal size, contour, position, consistency, mobility, non-tender              Adnexa: normal adnexa and no mass, fullness, tenderness               Rectovaginal: Confirms               Anus:  normal sphincter tone, no lesions  Chaperone present: yes  A:  Well Woman with normal exam  Menopausal no HRT  Atrophic vaginitis with Vagifem use working well  Cholesterol/hypertension/vitamin D/osteoporosis management with PCP  P:   Reviewed health and wellness pertinent to exam  Aware of need to advise if vaginal bleeding  Discussed risks/benefits and warning signs with Vagifem use. Questions addressed. Request continuance  Rx Vagifem see order with instructions  Continue follow up with PCP as indicated  Pap smear: no   counseled on breast self exam, mammography screening, feminine hygiene, adequate intake of calcium and vitamin D, diet and exercise, Kegel's exercises  return annually or prn  An After Visit Summary was printed and given to the patient.

## 2017-08-26 NOTE — Patient Instructions (Signed)

## 2018-05-17 ENCOUNTER — Other Ambulatory Visit (HOSPITAL_BASED_OUTPATIENT_CLINIC_OR_DEPARTMENT_OTHER): Payer: Self-pay | Admitting: Internal Medicine

## 2018-05-17 DIAGNOSIS — Z1231 Encounter for screening mammogram for malignant neoplasm of breast: Secondary | ICD-10-CM

## 2018-06-09 ENCOUNTER — Ambulatory Visit (HOSPITAL_BASED_OUTPATIENT_CLINIC_OR_DEPARTMENT_OTHER)
Admission: RE | Admit: 2018-06-09 | Discharge: 2018-06-09 | Disposition: A | Discharge status: Home / Self Care | Payer: BLUE CROSS/BLUE SHIELD | Source: Ambulatory Visit | Attending: Internal Medicine | Admitting: Internal Medicine

## 2018-06-09 DIAGNOSIS — Z1231 Encounter for screening mammogram for malignant neoplasm of breast: Secondary | ICD-10-CM | POA: Insufficient documentation

## 2018-08-29 ENCOUNTER — Other Ambulatory Visit: Payer: Self-pay | Admitting: Certified Nurse Midwife

## 2018-08-29 DIAGNOSIS — N952 Postmenopausal atrophic vaginitis: Secondary | ICD-10-CM

## 2018-08-30 NOTE — Telephone Encounter (Signed)
Call to patient. Patient states she has enough vagifem to last her until her appointment with Leota Sauers, CNM on 09-10-2018. Advised patient would update [facility]. Patient agreeable.

## 2018-09-09 ENCOUNTER — Other Ambulatory Visit: Payer: Self-pay | Admitting: Certified Nurse Midwife

## 2018-09-10 ENCOUNTER — Ambulatory Visit (INDEPENDENT_AMBULATORY_CARE_PROVIDER_SITE_OTHER): Payer: BLUE CROSS/BLUE SHIELD | Admitting: Certified Nurse Midwife

## 2018-09-10 ENCOUNTER — Other Ambulatory Visit: Payer: Self-pay

## 2018-09-10 ENCOUNTER — Encounter: Payer: Self-pay | Admitting: Certified Nurse Midwife

## 2018-09-10 ENCOUNTER — Other Ambulatory Visit (HOSPITAL_COMMUNITY)
Admission: RE | Admit: 2018-09-10 | Discharge: 2018-09-10 | Disposition: A | Discharge status: Home / Self Care | Payer: BLUE CROSS/BLUE SHIELD | Source: Ambulatory Visit | Attending: Obstetrics & Gynecology | Admitting: Obstetrics & Gynecology

## 2018-09-10 VITALS — BP 102/62 | HR 68 | Resp 16 | Ht 63.25 in | Wt 118.0 lb

## 2018-09-10 DIAGNOSIS — Z01419 Encounter for gynecological examination (general) (routine) without abnormal findings: Secondary | ICD-10-CM | POA: Diagnosis not present

## 2018-09-10 DIAGNOSIS — N952 Postmenopausal atrophic vaginitis: Secondary | ICD-10-CM | POA: Diagnosis not present

## 2018-09-10 DIAGNOSIS — Z124 Encounter for screening for malignant neoplasm of cervix: Secondary | ICD-10-CM | POA: Diagnosis not present

## 2018-09-10 MED ORDER — ESTRADIOL 10 MCG VA TABS: ORAL_TABLET | VAGINAL | 3 refills | Status: DC

## 2018-09-10 NOTE — Progress Notes (Signed)
63 y.o. G0P0000 Married  Caucasian Fe here for annual exam. Post-menopausal no vaginal bleeding. Continues with Vagifem for vaginal dryness, working well. Cost will be increasing and may need to change. Has not had any UTI's since starting Vagifem. Will be using Medicare for purchase later in the year.Sonya Joyce Dr. Clelia Croft PCP for cholesterol, hypertension and anxiety management, labs and aex. All medications stable. No other health issues today.  Patient's last menstrual period was 09/04/2005 (approximate).          Sexually active: No.  The current method of family planning is post menopausal status.    Exercising: Yes.    walking, weights, eliptical Smoker:  no  Review of Systems  Constitutional: Negative.   HENT: Negative.   Eyes: Negative.   Respiratory: Negative.   Cardiovascular: Negative.   Gastrointestinal: Negative.   Genitourinary: Negative.   Musculoskeletal: Negative.   Skin: Negative.   Neurological: Negative.   Endo/Heme/Allergies: Negative.   Psychiatric/Behavioral: Negative.     Health Maintenance: Pap:  2/17 neg HPV HR neg History of Abnormal Pap: no MMG:  06-09-18 category c density birads 1:neg Self Breast exams: no Colonoscopy:  2017 f/u 74yrs BMD:   2017 with pcp TDaP:  2018 Shingles: 2019 Pneumonia: had done? Hep C and HIV: HIV neg 2017 Labs: PCP   reports that she has never smoked. She has never used smokeless tobacco. She reports current alcohol use of about 5.0 standard drinks of alcohol per week. She reports that she does not use drugs.  Past Medical History:  Diagnosis Date  . Atrophic vaginitis   . Hypertension   . Vitamin D deficiency     Past Surgical History:  Procedure Laterality Date  . WISDOM TOOTH EXTRACTION  age 46    Current Outpatient Medications  Medication Sig Dispense Refill  . alendronate (FOSAMAX) 70 MG tablet Take 1 tablet by mouth once a week.    . ALPRAZolam (XANAX) 0.5 MG tablet Take 1 tablet by mouth as needed.    .  ergocalciferol (VITAMIN D2) 50000 units capsule Take 1 capsule (50,000 Units total) by mouth every 30 (thirty) days. (Patient taking differently: Take 50,000 Units by mouth once a week. ) 30 capsule 3  . escitalopram (LEXAPRO) 10 MG tablet     . Estradiol (VAGIFEM) 10 MCG TABS vaginal tablet 1 tablet to vagina two times per week. 24 tablet 3  . losartan-hydrochlorothiazide (HYZAAR) 100-12.5 MG per tablet Take 1 tablet by mouth daily.    Marland Kitchen olmesartan-hydrochlorothiazide (BENICAR HCT) 40-12.5 MG tablet     . rosuvastatin (CRESTOR) 10 MG tablet     . simvastatin (ZOCOR) 20 MG tablet TK 1 T PO QPM AFTER DINNER  2   No current facility-administered medications for this visit.     Family History  Problem Relation Age of Onset  . Hepatitis C Mother   . Hypertension Mother   . Prostate cancer Father 50  . Hypertension Father   . Multiple births Brother        has a set of twins  . Hypertension Brother     ROS:  Pertinent items are noted in HPI.  Otherwise, a comprehensive ROS was negative.  Exam:   Ht 5' 3.25" (1.607 m)   Wt 118 lb (53.5 kg)   LMP 09/04/2005 (Approximate)   BMI 20.74 kg/m  Height: 5' 3.25" (160.7 cm) Ht Readings from Last 3 Encounters:  09/10/18 5' 3.25" (1.607 m)  08/26/17 5' 3.25" (1.607 m)  08/25/16 5'  3" (1.6 m)    General appearance: alert, cooperative and appears stated age Head: Normocephalic, without obvious abnormality, atraumatic Neck: no adenopathy, supple, symmetrical, trachea midline and thyroid normal to inspection and palpation Lungs: clear to auscultation bilaterally Breasts: normal appearance, no masses or tenderness, No nipple retraction or dimpling, No nipple discharge or bleeding, No axillary or supraclavicular adenopathy Heart: regular rate and rhythm Abdomen: soft, non-tender; no masses,  no organomegaly Extremities: extremities normal, atraumatic, no cyanosis or edema Skin: Skin color, texture, turgor normal. No rashes or lesions Lymph  nodes: Cervical, supraclavicular, and axillary nodes normal. No abnormal inguinal nodes palpated Neurologic: Grossly normal   Pelvic: External genitalia:  no lesions              Urethra:  normal appearing urethra with no masses, tenderness or lesions              Bartholin's and Skene's: normal                 Vagina: normal appearing vagina with normal color and discharge, no lesions              Cervix: no cervical motion tenderness and no lesions              Pap taken: Yes.   Bimanual Exam:  Uterus:  normal size, contour, position, consistency, mobility, non-tender and anteverted              Adnexa: normal adnexa and no mass, fullness, tenderness               Rectovaginal: Confirms               Anus:  normal sphincter tone, no lesions  Chaperone present: yes  A:  Well Woman with normal exam  Post menopausal no HRT  Atrophic vaginitis Vagifem working well, no UTI's  Hypertension, cholesterol, anxiety medication management with PCP  P:   Reviewed health and wellness pertinent to exam  Aware of need to advise if vaginal bleeding  Discussed risks/benefits/warning signs with use  Rx Vagifem see order with instructions  Pap smear: yes   counseled on breast self exam, mammography screening, feminine hygiene, adequate intake of calcium and vitamin D, diet and exercise  return annually or prn  An After Visit Summary was printed and given to the patient.

## 2018-09-10 NOTE — Patient Instructions (Signed)

## 2018-09-14 LAB — CYTOLOGY - PAP
Diagnosis: NEGATIVE
HPV: NOT DETECTED

## 2019-04-13 ENCOUNTER — Other Ambulatory Visit: Payer: Self-pay | Admitting: Certified Nurse Midwife

## 2019-04-13 NOTE — Telephone Encounter (Signed)
Sonya Joyce calling for refill request on estradiol tablets. [phone]

## 2019-04-13 NOTE — Telephone Encounter (Signed)
Called made to pharmacy to verify rx and remaining refills. Patient notified rx is waiting for her.

## 2019-05-31 ENCOUNTER — Other Ambulatory Visit (HOSPITAL_BASED_OUTPATIENT_CLINIC_OR_DEPARTMENT_OTHER): Payer: Self-pay | Admitting: Internal Medicine

## 2019-05-31 DIAGNOSIS — Z1231 Encounter for screening mammogram for malignant neoplasm of breast: Secondary | ICD-10-CM

## 2019-06-22 ENCOUNTER — Other Ambulatory Visit: Payer: Self-pay

## 2019-06-22 ENCOUNTER — Ambulatory Visit (HOSPITAL_BASED_OUTPATIENT_CLINIC_OR_DEPARTMENT_OTHER)
Admission: RE | Admit: 2019-06-22 | Discharge: 2019-06-22 | Disposition: A | Discharge status: Home / Self Care | Payer: Medicare Other | Source: Ambulatory Visit | Attending: Internal Medicine | Admitting: Internal Medicine

## 2019-06-22 DIAGNOSIS — Z1231 Encounter for screening mammogram for malignant neoplasm of breast: Secondary | ICD-10-CM | POA: Insufficient documentation

## 2019-06-24 ENCOUNTER — Ambulatory Visit (HOSPITAL_BASED_OUTPATIENT_CLINIC_OR_DEPARTMENT_OTHER): Payer: BLUE CROSS/BLUE SHIELD

## 2019-07-27 ENCOUNTER — Other Ambulatory Visit: Payer: Medicare Other

## 2019-09-12 NOTE — Progress Notes (Signed)
64 y.o. G0P0000 Married  Caucasian Fe here for annual exam. Post menopausal no HRT. Denies vaginal bleeding, or dryness. Using Vagifem with good results, cost has increased would like to try once weekly. Sees Dr. Clelia Croft for aex, with hypertension, cholesterol, and anxiety. All medications stable. No other health issues today.  Patient's last menstrual period was 09/04/2005 (approximate).          Sexually active: Yes.    The current method of family planning is post menopausal status.    Exercising: Yes.    walking, weights & eliptical Smoker:  no  Review of Systems  Constitutional: Negative.   HENT: Negative.   Eyes: Negative.   Respiratory: Negative.   Cardiovascular: Negative.   Gastrointestinal: Negative.   Genitourinary: Negative.   Musculoskeletal: Negative.   Skin: Negative.   Neurological: Negative.   Endo/Heme/Allergies: Negative.   Psychiatric/Behavioral: Negative.     Health Maintenance: Pap:  2/17 neg HPV HR neg, 09-10-2018 neg HPV HR neg History of Abnormal Pap: no MMG:  06-22-2019 category c density birads 1:neg Self Breast exams: no Colonoscopy:  2017 f/u 33yrs BMD:   2017 with pcp TDaP:  2018 Shingles: 2019 Pneumonia: had 1st one Hep C and HIV: HIV neg 2017 Labs: PCP   reports that she has never smoked. She has never used smokeless tobacco. She reports current alcohol use of about 5.0 standard drinks of alcohol per week. She reports that she does not use drugs.  Past Medical History:  Diagnosis Date  . Atrophic vaginitis   . Hypertension   . Vitamin D deficiency     Past Surgical History:  Procedure Laterality Date  . WISDOM TOOTH EXTRACTION  age 64    Current Outpatient Medications  Medication Sig Dispense Refill  . ALPRAZolam (XANAX) 0.5 MG tablet Take 1 tablet by mouth as needed.    Marland Kitchen amLODipine (NORVASC) 5 MG tablet Take 5 mg by mouth daily.    . ergocalciferol (VITAMIN D2) 50000 units capsule Take 1 capsule (50,000 Units total) by mouth every 30  (thirty) days. (Patient taking differently: Take 50,000 Units by mouth once a week. ) 30 capsule 3  . Estradiol (VAGIFEM) 10 MCG TABS vaginal tablet 1 tablet to vagina two times per week. 24 tablet 3  . olmesartan (BENICAR) 40 MG tablet TK 1 T PO D    . rosuvastatin (CRESTOR) 10 MG tablet      No current facility-administered medications for this visit.    Family History  Problem Relation Age of Onset  . Hepatitis C Mother   . Hypertension Mother   . Prostate cancer Father 9  . Hypertension Father   . Multiple births Brother        has a set of twins  . Hypertension Brother     ROS:  Pertinent items are noted in HPI.  Otherwise, a comprehensive ROS was negative.  Exam:   BP 110/60   Pulse 68   Temp 98.1 F (36.7 C) (Skin)   Resp 16   Ht 5' 3.25" (1.607 m)   Wt 121 lb (54.9 kg)   LMP 09/04/2005 (Approximate)   BMI 21.27 kg/m  Height: 5' 3.25" (160.7 cm) Ht Readings from Last 3 Encounters:  09/14/19 5' 3.25" (1.607 m)  09/10/18 5' 3.25" (1.607 m)  08/26/17 5' 3.25" (1.607 m)    General appearance: alert, cooperative and appears stated age Head: Normocephalic, without obvious abnormality, atraumatic Neck: no adenopathy, supple, symmetrical, trachea midline and thyroid normal to inspection  and palpation Lungs: clear to auscultation bilaterally Breasts: normal appearance, no masses or tenderness, No nipple retraction or dimpling, No nipple discharge or bleeding, No axillary or supraclavicular adenopathy Heart: regular rate and rhythm Abdomen: soft, non-tender; no masses,  no organomegaly Extremities: extremities normal, atraumatic, no cyanosis or edema Skin: Skin color, texture, turgor normal. No rashes or lesions Lymph nodes: Cervical, supraclavicular, and axillary nodes normal. No abnormal inguinal nodes palpated Neurologic: Grossly normal   Pelvic: External genitalia:  no lesions              Urethra:  normal appearing urethra with no masses, tenderness or  lesions              Bartholin's and Skene's: normal                 Vagina: normal appearing vagina with normal color and discharge, no lesions              Cervix: no cervical motion tenderness, no lesions and normal appearance              Pap taken: No. Bimanual Exam:  Uterus:  normal size, contour, position, consistency, mobility, non-tender and anteverted              Adnexa: normal adnexa and no mass, fullness, tenderness               Rectovaginal: Confirms               Anus:  normal sphincter tone, no lesions  Chaperone present: yes  A:  Well Woman with normal exam  Post menopausal no HRT  Atrophic vaginitis with Vagifem use working well  Cholesterol/hypertensive management with PCP   P:   Reviewed health and wellness pertinent to exam  Aware of need to advise if vaginal bleeding.  Discussed risks/benefits of Vagifem use with vaginal dryness and decrease UTI risk. Patient will try once weekly and see if this works as well. Discussed Coconut or Olive oil use for dryness if needed. Not sexually active. Will advise.  Rx Vagifem see order with instructions  Continue follow up with PCP as indicated.  Pap smear: no   counseled on breast self exam, mammography screening, feminine hygiene, menopause, adequate intake of calcium and vitamin D, diet and exercise  Rv aex, prn  An After Visit Summary was printed and given to the patient.

## 2019-09-14 ENCOUNTER — Encounter: Payer: Self-pay | Admitting: Certified Nurse Midwife

## 2019-09-14 ENCOUNTER — Ambulatory Visit (INDEPENDENT_AMBULATORY_CARE_PROVIDER_SITE_OTHER): Payer: Medicare Other | Admitting: Certified Nurse Midwife

## 2019-09-14 ENCOUNTER — Other Ambulatory Visit: Payer: Self-pay

## 2019-09-14 VITALS — BP 110/60 | HR 68 | Temp 98.1°F | Resp 16 | Ht 63.25 in | Wt 121.0 lb

## 2019-09-14 DIAGNOSIS — Z01419 Encounter for gynecological examination (general) (routine) without abnormal findings: Secondary | ICD-10-CM

## 2019-09-14 DIAGNOSIS — N952 Postmenopausal atrophic vaginitis: Secondary | ICD-10-CM

## 2019-09-14 DIAGNOSIS — Z124 Encounter for screening for malignant neoplasm of cervix: Secondary | ICD-10-CM

## 2019-09-14 MED ORDER — ESTRADIOL 10 MCG VA TABS: ORAL_TABLET | VAGINAL | 3 refills | Status: DC

## 2019-09-14 NOTE — Patient Instructions (Signed)
EXERCISE AND DIET:  We recommended that you start or continue a regular exercise program for good health. Regular exercise means any activity that makes your heart beat faster and makes you sweat.  We recommend exercising at least 30 minutes per day at least 3 days a week, preferably 4 or 5.  We also recommend a diet low in fat and sugar.  Inactivity, poor dietary choices and obesity can cause diabetes, heart attack, stroke, and kidney damage, among others.   ° °ALCOHOL AND SMOKING:  Women should limit their alcohol intake to no more than 7 drinks/beers/glasses of wine (combined, not each!) per week. Moderation of alcohol intake to this level decreases your risk of breast cancer and liver damage. And of course, no recreational drugs are part of a healthy lifestyle.  And absolutely no smoking or even second hand smoke. Most people know smoking can cause heart and lung diseases, but did you know it also contributes to weakening of your bones? Aging of your skin?  Yellowing of your teeth and nails? ° °CALCIUM AND VITAMIN D:  Adequate intake of calcium and Vitamin D are recommended.  The recommendations for exact amounts of these supplements seem to change often, but generally speaking 600 mg of calcium (either carbonate or citrate) and 800 units of Vitamin D per day seems prudent. Certain women may benefit from higher intake of Vitamin D.  If you are among these women, your doctor will have told you during your visit.   ° °PAP SMEARS:  Pap smears, to check for cervical cancer or precancers,  have traditionally been done yearly, although recent scientific advances have shown that most women can have pap smears less often.  However, every woman still should have a physical exam from her gynecologist every year. It will include a breast check, inspection of the vulva and vagina to check for abnormal growths or skin changes, a visual exam of the cervix, and then an exam to evaluate the size and shape of the uterus and  ovaries.  And after 66 years of age, a rectal exam is indicated to check for rectal cancers. We will also provide age appropriate advice regarding health maintenance, like when you should have certain vaccines, screening for sexually transmitted diseases, bone density testing, colonoscopy, mammograms, etc.  ° °MAMMOGRAMS:  All women over 40 years old should have a yearly mammogram. Many facilities now offer a "3D" mammogram, which may cost around $50 extra out of pocket. If possible,  we recommend you accept the option to have the 3D mammogram performed.  It both reduces the number of women who will be called back for extra views which then turn out to be normal, and it is better than the routine mammogram at detecting truly abnormal areas.   ° °COLONOSCOPY:  Colonoscopy to screen for colon cancer is recommended for all women at age 50.  We know, you hate the idea of the prep.  We agree, BUT, having colon cancer and not knowing it is worse!!  Colon cancer so often starts as a polyp that can be seen and removed at colonscopy, which can quite literally save your life!  And if your first colonoscopy is normal and you have no family history of colon cancer, most women don't have to have it again for 10 years.  Once every ten years, you can do something that may end up saving your life, right?  We will be happy to help you get it scheduled when you are ready.    Be sure to check your insurance coverage so you understand how much it will cost.  It may be covered as a preventative service at no cost, but you should check your particular policy.   ° ° ° °Atrophic Vaginitis °Atrophic vaginitis is a condition in which the tissues that line the vagina become dry and thin. This condition occurs in women who have stopped having their period. It is caused by a drop in a female hormone (estrogen). This hormone helps: °· To keep the vagina moist. °· To make a clear fluid. This clear fluid helps: °? To make the vagina ready for  sex. °? To protect the vagina from infection. °If the lining of the vagina is dry and thin, it may cause irritation, burning, or itchiness. It may also: °· Make sex painful. °· Make an exam of your vagina painful. °· Cause bleeding. °· Make you lose interest in sex. °· Cause a burning feeling when you pee (urinate). °· Cause a brown or yellow fluid to come from your vagina. °Some women do not have symptoms. °Follow these instructions at home: °Medicines °· Take over-the-counter and prescription medicines only as told by your doctor. °· Do not use herbs or other medicines unless your doctor says it is okay. °· Use medicines for for dryness. These include: °? Oils to make the vagina soft. °? Creams. °? Moisturizers. °General instructions °· Do not douche. °· Do not use products that can make your vagina dry. These include: °? Scented sprays. °? Scented tampons. °? Scented soaps. °· Sex can help increase blood flow and soften the tissue in the vagina. If it hurts to have sex: °? Tell your partner. °? Use products to make sex more comfortable. Use these only as told by your doctor. °Contact a doctor if you: °· Have discharge from the vagina that is different than usual. °· Have a bad smell coming from your vagina. °· Have new symptoms. °· Do not get better. °· Get worse. °Summary °· Atrophic vaginitis is a condition in which the lining of the vagina becomes dry and thin. °· This condition affects women who have stopped having their periods. °· Treatment may include using products that help make the vagina soft. °· Call a doctor if do not get better with treatment. °This information is not intended to replace advice given to you by your health care provider. Make sure you discuss any questions you have with your health care provider. °Document Revised: 07/06/2017 Document Reviewed: 07/06/2017 °Elsevier Patient Education © 2020 [employer] Inc[employer] ° °

## 2019-09-27 ENCOUNTER — Encounter: Payer: Self-pay | Admitting: Certified Nurse Midwife

## 2020-07-24 ENCOUNTER — Other Ambulatory Visit (HOSPITAL_BASED_OUTPATIENT_CLINIC_OR_DEPARTMENT_OTHER): Payer: Self-pay | Admitting: Internal Medicine

## 2020-07-24 DIAGNOSIS — Z1231 Encounter for screening mammogram for malignant neoplasm of breast: Secondary | ICD-10-CM

## 2020-08-09 ENCOUNTER — Ambulatory Visit (HOSPITAL_BASED_OUTPATIENT_CLINIC_OR_DEPARTMENT_OTHER)
Admission: RE | Admit: 2020-08-09 | Discharge: 2020-08-09 | Disposition: A | Discharge status: Home / Self Care | Payer: Medicare Other | Source: Ambulatory Visit | Attending: Internal Medicine | Admitting: Internal Medicine

## 2020-08-09 ENCOUNTER — Other Ambulatory Visit: Payer: Self-pay

## 2020-08-09 DIAGNOSIS — Z1231 Encounter for screening mammogram for malignant neoplasm of breast: Secondary | ICD-10-CM | POA: Insufficient documentation

## 2020-10-22 NOTE — Progress Notes (Deleted)
65 y.o. G0P0000 Married Caucasian female here for Breast and Pelvic exam.    PCP:  Martha Clan, MD   Patient's last menstrual period was 09/04/2005 (approximate).           Sexually active: no  The current method of family planning is post menopausal status. Exercising: yes. Walking, weihgts, elliptical Smoker: no   Health Maintenance: Pap:  09-10-18 Neg:Neg HR HPV, 08-14-15 Neg:Neg HR HPV History of abnormal Pap: no MMG:  08/10/20 Density C density. Bi-rads 1 Neg Colonoscopy: 2017 f/u 17yrs BMD:   2017 with*** PCP TDaP: 2018 Shingles: 2019 HIV: Neg 2017 Hep C: Neg 2017 Screening Labs:  Hb today: ***, Urine today: ***   reports that she has never smoked. She has never used smokeless tobacco. She reports current alcohol use of about 5.0 standard drinks of alcohol per week. She reports that she does not use drugs.  Past Medical History:  Diagnosis Date  . Atrophic vaginitis   . Hypertension   . Vitamin D deficiency     Past Surgical History:  Procedure Laterality Date  . WISDOM TOOTH EXTRACTION  age 39    Current Outpatient Medications  Medication Sig Dispense Refill  . ALPRAZolam (XANAX) 0.5 MG tablet Take 1 tablet by mouth as needed.    Marland Kitchen amLODipine (NORVASC) 5 MG tablet Take 5 mg by mouth daily.    . ergocalciferol (VITAMIN D2) 50000 units capsule Take 1 capsule (50,000 Units total) by mouth every 30 (thirty) days. (Patient taking differently: Take 50,000 Units by mouth once a week. ) 30 capsule 3  . Estradiol (VAGIFEM) 10 MCG TABS vaginal tablet 1 tablet to vagina two times per week. 24 tablet 3  . olmesartan (BENICAR) 40 MG tablet TK 1 T PO D    . rosuvastatin (CRESTOR) 10 MG tablet      No current facility-administered medications for this visit.    Family History  Problem Relation Age of Onset  . Hepatitis C Mother   . Hypertension Mother   . Prostate cancer Father 55  . Hypertension Father   . Multiple births Brother        has a set of twins  . Hypertension  Brother     Review of Systems  Exam:   LMP 09/04/2005 (Approximate)     General appearance: alert, cooperative and appears stated age Head: normocephalic, without obvious abnormality, atraumatic Neck: no adenopathy, supple, symmetrical, trachea midline and thyroid normal to inspection and palpation Lungs: clear to auscultation bilaterally Breasts: normal appearance, no masses or tenderness, No nipple retraction or dimpling, No nipple discharge or bleeding, No axillary adenopathy Heart: regular rate and rhythm Abdomen: soft, non-tender; no masses, no organomegaly Extremities: extremities normal, atraumatic, no cyanosis or edema Skin: skin color, texture, turgor normal. No rashes or lesions Lymph nodes: cervical, supraclavicular, and axillary nodes normal. Neurologic: grossly normal  Pelvic: External genitalia:  no lesions              No abnormal inguinal nodes palpated.              Urethra:  normal appearing urethra with no masses, tenderness or lesions              Bartholins and Skenes: normal                 Vagina: normal appearing vagina with normal color and discharge, no lesions              Cervix: no lesions  Pap taken: {yes no:314532} Bimanual Exam:  Uterus:  normal size, contour, position, consistency, mobility, non-tender              Adnexa: no mass, fullness, tenderness              Rectal exam: {yes no:314532}.  Confirms.              Anus:  normal sphincter tone, no lesions  Chaperone was present for exam.  Assessment:   Well woman visit with normal exam.   Plan: Mammogram screening discussed. Self breast awareness reviewed. Pap and HR HPV as above. Guidelines for Calcium, Vitamin D, regular exercise program including cardiovascular and weight bearing exercise.   Follow up annually and prn.   Additional counseling given.  {yes [id number]. _______ minutes face to face time of which over 50% was spent in counseling.    After visit summary  provided.

## 2020-10-24 ENCOUNTER — Ambulatory Visit: Payer: Medicare Other | Admitting: Obstetrics and Gynecology

## 2020-11-02 NOTE — Progress Notes (Signed)
65 y.o. G0P0000 Married White or Caucasian female here for breast & pelvic.     Patient's last menstrual period was 09/04/2005 (approximate).         Using vagifem periodically and wants to continue.    Sexually active: No.  The current method of family planning is post menopausal status.    Exercising: Yes.    walking, weights & eliptical, golf Smoker:  no  Retired 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 She has never used smokeless tobacco. She reports current alcohol use of about 5.0 standard drinks of alcohol per week. She reports that she does not use drugs.  Past Medical History:  Diagnosis Date  . Atrophic vaginitis   . Hypertension   . Vitamin D deficiency     Past Surgical History:  Procedure Laterality Date  . WISDOM TOOTH EXTRACTION  age 16    Current Outpatient Medications  Medication Sig Dispense Refill  . amLODipine (NORVASC) 5 MG tablet Take 5 mg by mouth daily.    Marland Kitchen escitalopram (LEXAPRO) 20 MG tablet Take 1 tablet by mouth daily.    . Estradiol (VAGIFEM) 10 MCG TABS vaginal tablet 1 tablet to vagina two times per week. 24 tablet 3  . olmesartan (BENICAR) 40 MG tablet TK 1 T PO D    . rosuvastatin (CRESTOR) 10 MG tablet     . Vitamin D, Ergocalciferol, (DRISDOL) 1.25 MG (50000 UNIT) CAPS capsule Take 1 capsule by mouth once a week.     No current facility-administered medications for this visit.    Family History  Problem Relation Age of Onset  . Hepatitis C Mother   . Hypertension Mother   . Prostate cancer Father 70  . Hypertension Father   . Multiple births Brother        has a set of twins  . Hypertension Brother      Review of Systems  Exam:   BP 120/70   Pulse 64   Resp 16   Ht 5' 3.25" (1.607 m)   Wt 125 lb (56.7 kg)   LMP 09/04/2005 (Approximate)   BMI 21.97 kg/m   Height: 5' 3.25" (160.7 cm)  General appearance: alert, cooperative and appears stated age, no acute distress Neck: no adenopathy, thyroid normal to inspection and palpation Breasts: No axillary or supraclavicular adenopathy, Normal to palpation without dominant masses Abdomen: soft, non-tender; no masses,  no organomegaly Lymph nodes: Cervical, supraclavicular, and axillary nodes normal. No abnormal inguinal nodes palpated Neurologic: Grossly normal   Pelvic: External genitalia:  no lesions              Urethra:  normal appearing urethra with no masses, tenderness or lesions              Bartholins and Skenes: normal                 Vagina: normal appearing vagina, appropriate for age, normal appearing discharge, no lesions              Cervix: neg cervical motion tenderness, no visible lesions although unable to visualize cervix in entirety r/t discomfort of exam  Bimanual Exam:   Uterus:  normal size, contour, position, consistency, mobility, non-tender              Adnexa: no mass, fullness, tenderness              Rectal: no palpable mass   Irving Burton, RN Chaperone was present for exam.  A:  Well Woman with normal exam  Breast and pelvic screening  Genitourinary syndrome of menopause  P:   Pap : due 2025  Mammogram: 08/2021  Labs:n/a  Medications: Vagifem  Dexa with PCP  Colonoscopy due 2027

## 2020-11-05 ENCOUNTER — Other Ambulatory Visit: Payer: Self-pay

## 2020-11-05 ENCOUNTER — Encounter: Payer: Self-pay | Admitting: Nurse Practitioner

## 2020-11-05 ENCOUNTER — Ambulatory Visit (INDEPENDENT_AMBULATORY_CARE_PROVIDER_SITE_OTHER): Payer: Medicare Other | Admitting: Nurse Practitioner

## 2020-11-05 VITALS — BP 120/70 | HR 64 | Resp 16 | Ht 63.25 in | Wt 125.0 lb

## 2020-11-05 DIAGNOSIS — Z01419 Encounter for gynecological examination (general) (routine) without abnormal findings: Secondary | ICD-10-CM

## 2020-11-05 DIAGNOSIS — N952 Postmenopausal atrophic vaginitis: Secondary | ICD-10-CM

## 2020-11-05 MED ORDER — ESTRADIOL 10 MCG VA TABS: ORAL_TABLET | VAGINAL | 3 refills | Status: DC

## 2020-11-05 NOTE — Patient Instructions (Addendum)
Genitourinary Syndrome of Menopause (GSM)  Genitourinary Syndrome of menopause is a term that describes the spectrum of changes caused by the lack of estrogen in menopause. Common Signs and Symptoms Include: Vaginal dryness, irritation/burning/itching. Abnormal discharge, vaginal pressure, pain with sex, decreased sexual arousal/desire, difficulty achieving orgasm, pain with urination, urgency, incontinence of urine, recurrent bladder infections, urethral prolapse and others. Diagnosis can usually be made with history and pelvic exam. Other testing may be considered to rule out other potential abnormalities. Symptoms can be progressive and chronic. The goal of treatment is symptom relief.  There are several different approaches to improving symptoms:  Vaginal Moisturizers and lubricants: Glycerin Moisturizer (Replens), Silicone based products (Uberlube), water-based products (Restore Moisturizing Gel by [employer]), Hyaluronic Acid vaginal products (such as HYALO GYN), and natural oils such as Olive Oil, Almond Oil and Coconut Oil (Since coconut oil comes in solid preparations, you can form them into suppositories and insert into the vagina as needed). These products are Over-the-Counter (OTC) at [employer] and retail stores and DO NOT contain hormones.  How to use vaginal and vulvar moisturizers . Many vaginal moisturizers come with an applicator. You will need fill the applicator with the moisturizer and then insert it carefully into your vagina. You can put lubricant on the tip of the applicator to make it easier to insert into your vagina. . You can also use vaginal moisturizers on your vulvar tissues, including your inner and outer labia (the folds of skin around your vagina). To put these moisturizers on your vulva, put a small amount (pea or grape size) of moisturizer on your finger. Then, massage the moisturizer into your vaginal opening and onto your labia. . If you recently finished  cancer treatment, or are going through sudden menopause, you may need to use the moisturizers 3 to 5 times a week to relieve your symptoms. . Vaginal and vulvar moisturizers should be used before you go to bed, so the product can be fully absorbed.  Lifestyle modifications: smoking cessation, pelvic floor physical therapy, Kegel's exercises, vaginal dilators  Non-hormonal therapies: Lidocaine (topical anesthetic) to decrease sensitivity, Oral Ospemifeme (requires prescription), Laser therapy (Pros and Cons should be discussed with provider)  Hormones Therapies: For moderate to severe GSM, vaginal estrogen is considered the most effective treatment. It can be used in combination with vaginal moisturizers and lubricants. Estrogen is delivered in small doses in the vagina with various preparations available including creams, rings, and tablets. Estrogen therapy may be contraindicated with certain hormone sensitive cancers. Vaginal DHEA and Testosterone have shown effectiveness in some cases to help with relieving vaginal atrophy and have shown mixed results with libido. Risks and benefits should be discussed prior to starting therapy.   Health Maintenance After Age 1 After age 54, you are at a higher risk for certain long-term diseases and infections as well as injuries from falls. Falls are a major cause of broken bones and head injuries in people who are older than age 60. Getting regular preventive care can help to keep you healthy and well. Preventive care includes getting regular testing and making lifestyle changes as recommended by your health care provider. Talk with your health care provider about:  Which screenings and tests you should have. A screening is a test that checks for a disease when you have no symptoms.  A diet and exercise plan that is right for you. What should I know about screenings and tests to prevent falls? Screening and testing are the best ways to  find a health problem  early. Early diagnosis and treatment give you the best chance of managing medical conditions that are common after age 49. Certain conditions and lifestyle choices may make you more likely to have a fall. Your health care provider may recommend:  Regular vision checks. Poor vision and conditions such as cataracts can make you more likely to have a fall. If you wear glasses, make sure to get your prescription updated if your vision changes.  Medicine review. Work with your health care provider to regularly review all of the medicines you are taking, including over-the-counter medicines. Ask your health care provider about any side effects that may make you more likely to have a fall. Tell your health care provider if any medicines that you take make you feel dizzy or sleepy.  Osteoporosis screening. Osteoporosis is a condition that causes the bones to get weaker. This can make the bones weak and cause them to break more easily.  Blood pressure screening. Blood pressure changes and medicines to control blood pressure can make you feel dizzy.  Strength and balance checks. Your health care provider may recommend certain tests to check your strength and balance while standing, walking, or changing positions.  Foot health exam. Foot pain and numbness, as well as not wearing proper footwear, can make you more likely to have a fall.  Depression screening. You may be more likely to have a fall if you have a fear of falling, feel emotionally low, or feel unable to do activities that you used to do.  Alcohol use screening. Using too much alcohol can affect your balance and may make you more likely to have a fall. What actions can I take to lower my risk of falls? General instructions  Talk with your health care provider about your risks for falling. Tell your health care provider if: ? You fall. Be sure to tell your health care provider about all falls, even ones that seem minor. ? You feel dizzy, sleepy,  or off-balance.  Take over-the-counter and prescription medicines only as told by your health care provider. These include any supplements.  Eat a healthy diet and maintain a healthy weight. A healthy diet includes low-fat dairy products, low-fat (lean) meats, and fiber from whole grains, beans, and lots of fruits and vegetables. Home safety  Remove any tripping hazards, such as rugs, cords, and clutter.  Install safety equipment such as grab bars in bathrooms and safety rails on stairs.  Keep rooms and walkways well-lit. Activity  Follow a regular exercise program to stay fit. This will help you maintain your balance. Ask your health care provider what types of exercise are appropriate for you.  If you need a cane or walker, use it as recommended by your health care provider.  Wear supportive shoes that have nonskid soles.   Lifestyle  Do not drink alcohol if your health care provider tells you not to drink.  If you drink alcohol, limit how much you have: ? 0-1 drink a day for women. ? 0-2 drinks a day for men.  Be aware of how much alcohol is in your drink. In the [place] one drink equals one typical bottle of beer (12 oz), one-half glass of wine (5 oz), or one shot of hard liquor (1 oz).  Do not use any products that contain nicotine or tobacco, such as cigarettes and e-cigarettes. If you need help quitting, ask your health care provider. Summary  Having a healthy lifestyle and getting preventive care  can help to protect your health and wellness after age 51.  Screening and testing are the best way to find a health problem early and help you avoid having a fall. Early diagnosis and treatment give you the best chance for managing medical conditions that are more common for people who are older than age 47.  Falls are a major cause of broken bones and head injuries in people who are older than age 10. Take precautions to prevent a fall at home.  Work with your health care  provider to learn what changes you can make to improve your health and wellness and to prevent falls. This information is not intended to replace advice given to you by your health care provider. Make sure you discuss any questions you have with your health care provider. Document Revised: 10/14/2018 Document Reviewed: 05/06/2017 Elsevier Patient Education  2021 [employer].

## 2021-07-29 ENCOUNTER — Other Ambulatory Visit (HOSPITAL_BASED_OUTPATIENT_CLINIC_OR_DEPARTMENT_OTHER): Payer: Self-pay | Admitting: Internal Medicine

## 2021-07-29 DIAGNOSIS — Z1231 Encounter for screening mammogram for malignant neoplasm of breast: Secondary | ICD-10-CM

## 2021-08-12 ENCOUNTER — Ambulatory Visit (HOSPITAL_BASED_OUTPATIENT_CLINIC_OR_DEPARTMENT_OTHER)
Admission: RE | Admit: 2021-08-12 | Discharge: 2021-08-12 | Disposition: A | Discharge status: Home / Self Care | Payer: Medicare Other | Source: Ambulatory Visit | Attending: Internal Medicine | Admitting: Internal Medicine

## 2021-08-12 ENCOUNTER — Other Ambulatory Visit: Payer: Self-pay

## 2021-08-12 ENCOUNTER — Encounter (HOSPITAL_BASED_OUTPATIENT_CLINIC_OR_DEPARTMENT_OTHER): Payer: Self-pay

## 2021-08-12 DIAGNOSIS — Z1231 Encounter for screening mammogram for malignant neoplasm of breast: Secondary | ICD-10-CM | POA: Insufficient documentation

## 2021-11-06 ENCOUNTER — Encounter (HOSPITAL_BASED_OUTPATIENT_CLINIC_OR_DEPARTMENT_OTHER): Payer: Self-pay | Admitting: Obstetrics & Gynecology

## 2021-11-06 ENCOUNTER — Ambulatory Visit (INDEPENDENT_AMBULATORY_CARE_PROVIDER_SITE_OTHER): Payer: Medicare Other | Admitting: Obstetrics & Gynecology

## 2021-11-06 ENCOUNTER — Other Ambulatory Visit (HOSPITAL_COMMUNITY)
Admission: RE | Admit: 2021-11-06 | Discharge: 2021-11-06 | Disposition: A | Discharge status: Home / Self Care | Payer: Medicare Other | Source: Ambulatory Visit | Attending: Obstetrics & Gynecology | Admitting: Obstetrics & Gynecology

## 2021-11-06 VITALS — BP 133/61 | HR 63 | Ht 63.0 in | Wt 129.6 lb

## 2021-11-06 DIAGNOSIS — Z87440 Personal history of urinary (tract) infections: Secondary | ICD-10-CM

## 2021-11-06 DIAGNOSIS — N952 Postmenopausal atrophic vaginitis: Secondary | ICD-10-CM | POA: Diagnosis not present

## 2021-11-06 DIAGNOSIS — Z124 Encounter for screening for malignant neoplasm of cervix: Secondary | ICD-10-CM | POA: Insufficient documentation

## 2021-11-06 DIAGNOSIS — Z780 Asymptomatic menopausal state: Secondary | ICD-10-CM

## 2021-11-06 DIAGNOSIS — I10 Essential (primary) hypertension: Secondary | ICD-10-CM

## 2021-11-06 DIAGNOSIS — E785 Hyperlipidemia, unspecified: Secondary | ICD-10-CM

## 2021-11-06 MED ORDER — ESTRADIOL 10 MCG VA TABS: ORAL_TABLET | VAGINAL | 3 refills | Status: DC

## 2021-11-06 NOTE — Progress Notes (Signed)
66 y.o. G0P0000 Married White or Caucasian female here for new patient exam.  Former patient of Debbi Darcel Bayley.  Uses vaginal estradiol tablets for vaginal dryness.  H/o recurrent UTIs and this has improved since using this.  Desires to continue and does need refill.  Denies vaginal bleeding. ? ?PCP:  Dr. Clelia Croft ? ?Patient's last menstrual period was 09/04/2005 (approximate).          ?Sexually active: Yes.    ?H/O STD:  no ? ?Health Maintenance: ?PCP:  Dr. Clelia Croft.  Last wellness appt was 09/2021.  Did blood work at that appt:  yes ?Vaccines are up to date:  has not had prevnar 20 ?Colonoscopy:  Dr. Matthias Hughs, 2017, follow up due this year ?MMG:  08/12/2021 Negative ?BMD:  does with DR. Clelia Croft ?Last pap smear:  09/10/2018 Negative.   ?H/o abnormal pap smear:  no hx ? ? ? reports that she has never smoked. She has never used smokeless tobacco. She reports current alcohol use of about 5.0 standard drinks per week. She reports that she does not use drugs. ? ?Past Medical History:  ?Diagnosis Date  ? Atrophic vaginitis   ? Hypertension   ? Vitamin D deficiency   ? ? ?Past Surgical History:  ?Procedure Laterality Date  ? WISDOM TOOTH EXTRACTION  age 18  ? ? ?Current Outpatient Medications  ?Medication Sig Dispense Refill  ? amLODipine (NORVASC) 5 MG tablet Take 5 mg by mouth daily.    ? escitalopram (LEXAPRO) 20 MG tablet Take 1 tablet by mouth daily.    ? Estradiol (VAGIFEM) 10 MCG TABS vaginal tablet 1 tablet to vagina two times per week. 24 tablet 3  ? olmesartan (BENICAR) 40 MG tablet TK 1 T PO D    ? rosuvastatin (CRESTOR) 10 MG tablet     ? Vitamin D, Ergocalciferol, (DRISDOL) 1.25 MG (50000 UNIT) CAPS capsule Take 1 capsule by mouth once a week.    ? ?No current facility-administered medications for this visit.  ? ? ?Family History  ?Problem Relation Age of Onset  ? Hepatitis C Mother   ? Hypertension Mother   ? Prostate cancer Father 17  ? Hypertension Father   ? Multiple births Brother   ?     has a set of twins  ?  Hypertension Brother   ? ? ?Review of Systems  ?All other systems reviewed and are negative. ? ?Exam:   ?BP 133/61 (BP Location: Left Arm, Patient Position: Sitting, Cuff Size: Large)   Pulse 63   Ht 5\' 3"  (1.6 m) Comment: Reported  Wt 129 lb 9.6 oz (58.8 kg)   LMP 09/04/2005 (Approximate)   BMI 22.96 kg/m?   Height: 5\' 3"  (160 cm) (Reported) ? ?General appearance: alert, cooperative and appears stated age ?Breasts: normal appearance, no masses or tenderness ?Abdomen: soft, non-tender; bowel sounds normal; no masses,  no organomegaly ?Lymph nodes: Cervical, supraclavicular, and axillary nodes normal.  No abnormal inguinal nodes palpated ?Neurologic: Grossly normal ? ?Pelvic: External genitalia:  no lesions ?             Urethra:  normal appearing urethra with no masses, tenderness or lesions ?             Bartholins and Skenes: normal    ?             Vagina: normal appearing vagina with atrophic changes and no discharge, no lesions ?             Cervix:  no lesions ?             Pap taken: Yes.   ?Bimanual Exam:  Uterus:  normal size, contour, position, consistency, mobility, non-tender ?             Adnexa: normal adnexa and no mass, fullness, tenderness ?              Rectovaginal: Confirms ?              Anus:  normal sphincter tone, no lesions ? ?Chaperone, Ina Homes, CMA, was present for exam. ? ?Assessment/Plan: ?1. Postmenopausal ?- pap smear obtained today ?- MMG 08/2021 ?- colonoscopy 2017, follow up due later this year.  Provider options discussed as Dr. Matthias Hughs has retired ?- BMD done at [facility] ?- vaccines reviewed/updated ? ?2. Vaginal atrophy ?- Estradiol (VAGIFEM) 10 MCG TABS vaginal tablet; 1 tablet to vagina two times per week.  Dispense: 24 tablet; Refill: 3 ? ?3. History of recurrent UTIs ?- stable at this time.  No prescriptions needed. ? ? ? ? ?

## 2021-11-07 LAB — CYTOLOGY - PAP
Adequacy: ABSENT
Diagnosis: NEGATIVE

## 2021-11-08 DIAGNOSIS — I10 Essential (primary) hypertension: Secondary | ICD-10-CM | POA: Insufficient documentation

## 2021-11-08 DIAGNOSIS — Z87440 Personal history of urinary (tract) infections: Secondary | ICD-10-CM | POA: Insufficient documentation

## 2021-11-08 DIAGNOSIS — E785 Hyperlipidemia, unspecified: Secondary | ICD-10-CM | POA: Insufficient documentation

## 2021-11-08 DIAGNOSIS — N952 Postmenopausal atrophic vaginitis: Secondary | ICD-10-CM | POA: Insufficient documentation

## 2021-11-11 ENCOUNTER — Ambulatory Visit (HOSPITAL_BASED_OUTPATIENT_CLINIC_OR_DEPARTMENT_OTHER): Payer: BLUE CROSS/BLUE SHIELD | Admitting: Obstetrics & Gynecology

## 2022-07-09 ENCOUNTER — Other Ambulatory Visit (HOSPITAL_BASED_OUTPATIENT_CLINIC_OR_DEPARTMENT_OTHER): Payer: Self-pay | Admitting: Internal Medicine

## 2022-07-09 DIAGNOSIS — Z1231 Encounter for screening mammogram for malignant neoplasm of breast: Secondary | ICD-10-CM

## 2022-08-18 ENCOUNTER — Encounter (HOSPITAL_BASED_OUTPATIENT_CLINIC_OR_DEPARTMENT_OTHER): Payer: Self-pay

## 2022-08-18 ENCOUNTER — Ambulatory Visit (HOSPITAL_BASED_OUTPATIENT_CLINIC_OR_DEPARTMENT_OTHER)
Admission: RE | Admit: 2022-08-18 | Discharge: 2022-08-18 | Disposition: A | Discharge status: Home / Self Care | Payer: Medicare Other | Source: Ambulatory Visit | Attending: Internal Medicine | Admitting: Internal Medicine

## 2022-08-18 DIAGNOSIS — Z1231 Encounter for screening mammogram for malignant neoplasm of breast: Secondary | ICD-10-CM

## 2022-10-10 ENCOUNTER — Other Ambulatory Visit: Payer: Self-pay | Admitting: Internal Medicine

## 2022-10-10 DIAGNOSIS — R29898 Other symptoms and signs involving the musculoskeletal system: Secondary | ICD-10-CM

## 2022-10-20 ENCOUNTER — Other Ambulatory Visit: Payer: Self-pay | Admitting: Internal Medicine

## 2022-10-20 DIAGNOSIS — R29898 Other symptoms and signs involving the musculoskeletal system: Secondary | ICD-10-CM

## 2022-10-30 ENCOUNTER — Ambulatory Visit
Admission: RE | Admit: 2022-10-30 | Discharge: 2022-10-30 | Disposition: A | Discharge status: Home / Self Care | Payer: Medicare Other | Source: Ambulatory Visit | Attending: Internal Medicine | Admitting: Internal Medicine

## 2022-10-30 DIAGNOSIS — R29898 Other symptoms and signs involving the musculoskeletal system: Secondary | ICD-10-CM

## 2022-10-30 MED ORDER — GADOPICLENOL 0.5 MMOL/ML IV SOLN
6.0000 mL | Freq: Once | INTRAVENOUS | Status: AC | PRN
  Administered 2022-10-30: 6 mL via INTRAVENOUS

## 2022-11-28 ENCOUNTER — Other Ambulatory Visit (HOSPITAL_BASED_OUTPATIENT_CLINIC_OR_DEPARTMENT_OTHER): Payer: Self-pay | Admitting: *Deleted

## 2022-11-28 DIAGNOSIS — N952 Postmenopausal atrophic vaginitis: Secondary | ICD-10-CM

## 2022-11-28 MED ORDER — ESTRADIOL 10 MCG VA TABS: ORAL_TABLET | VAGINAL | 0 refills | Status: DC

## 2022-12-05 IMAGING — MG MM DIGITAL SCREENING BILAT W/ TOMO AND CAD
8 series · 9 of 24 positions shown · non-contrast
Comparison: Previous exam(s).

CLINICAL DATA: Screening.

EXAM:
DIGITAL SCREENING BILATERAL MAMMOGRAM WITH TOMOSYNTHESIS AND CAD
TECHNIQUE: Bilateral screening digital craniocaudal and mediolateral oblique
mammograms were obtained. Bilateral screening digital breast
tomosynthesis was performed. The images were evaluated with
computer-aided detection.

[L CC synth-2D]
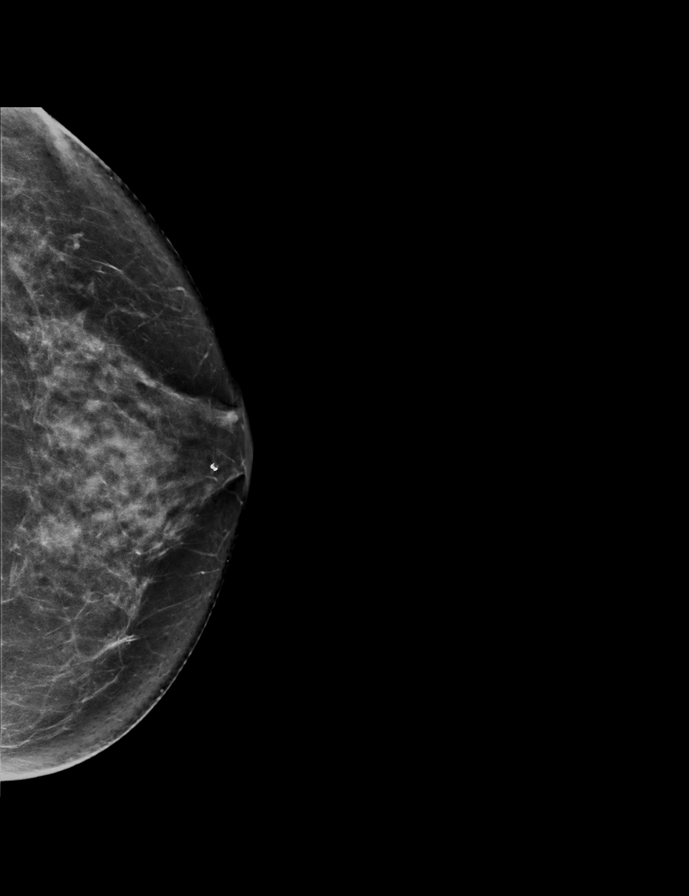

[L MLO synth-2D]
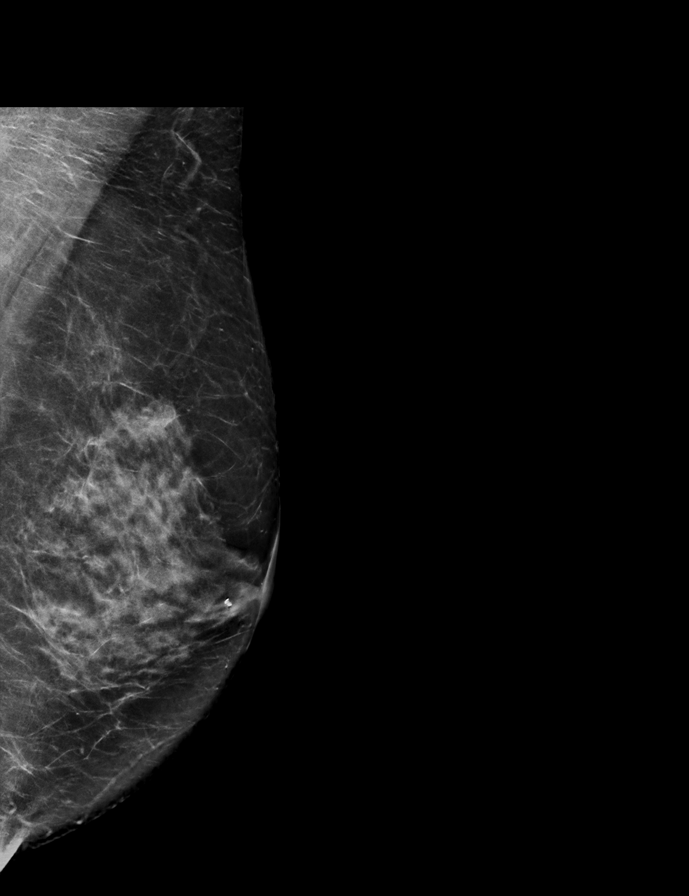

[R CC synth-2D]
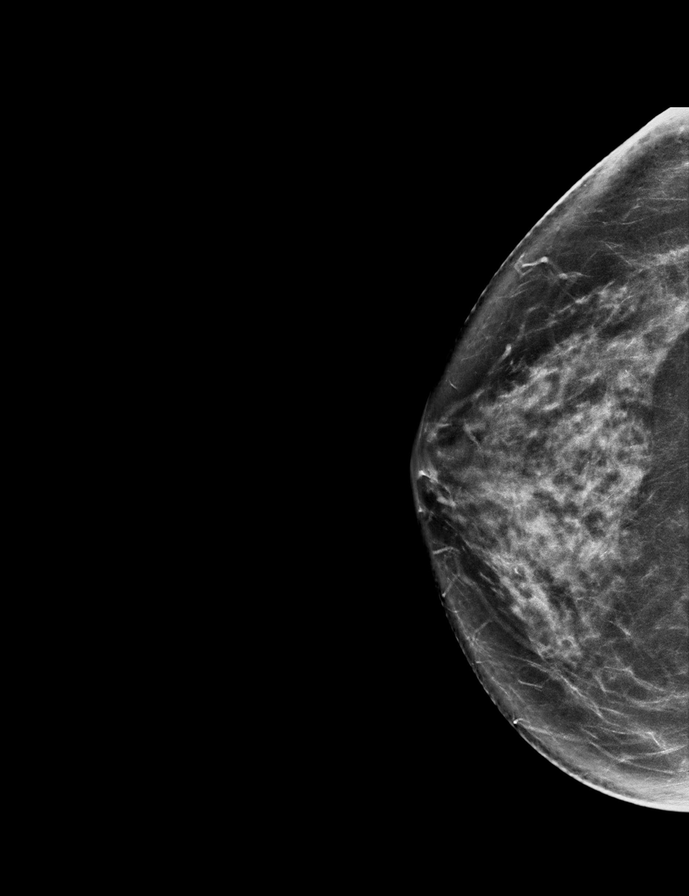

[R MLO synth-2D]
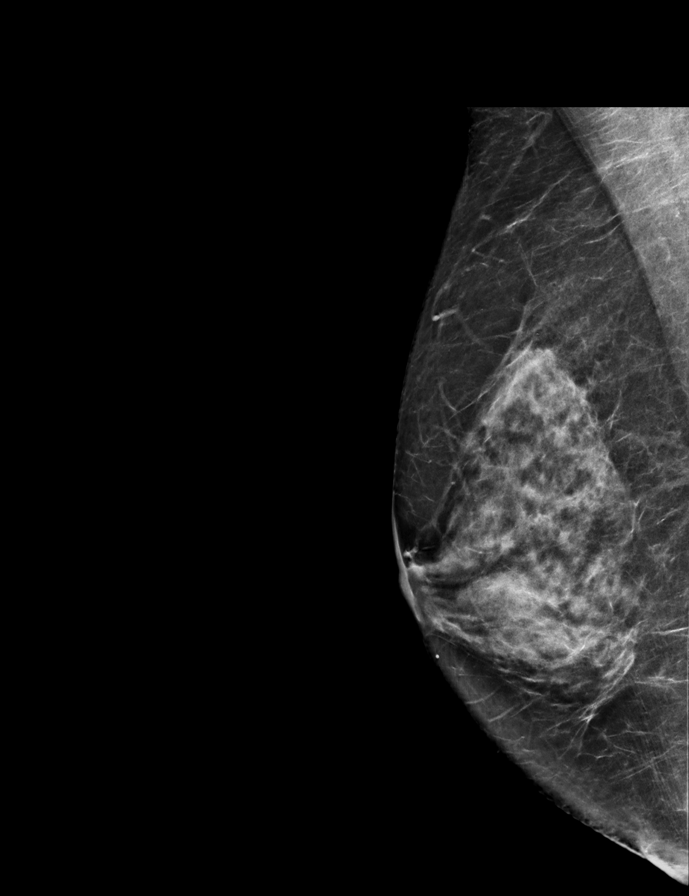

[L CC tomo · 2 of 77 frames shown]
[frame 25/77]
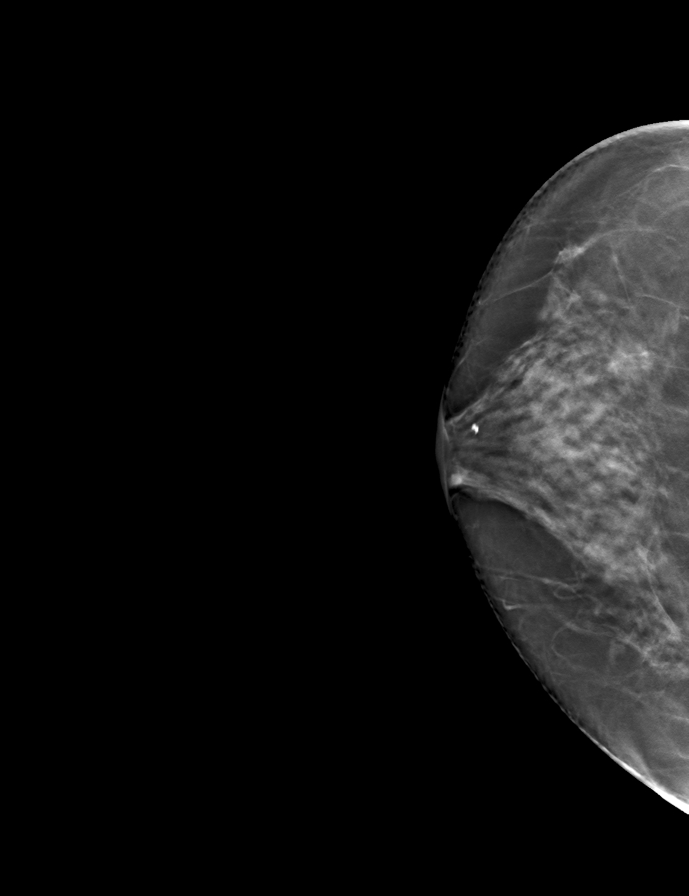
[frame 39/77]
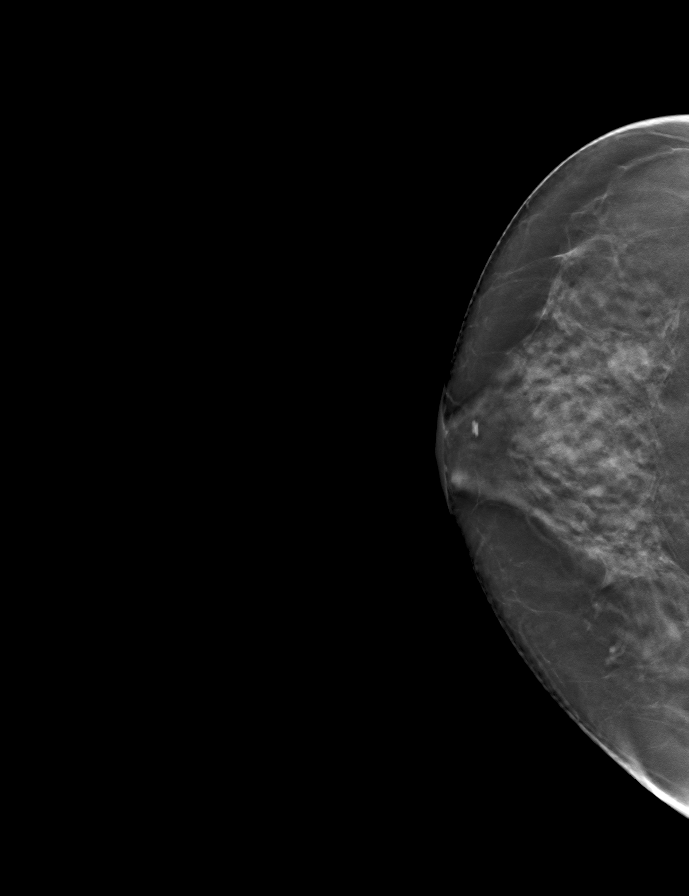

[L MLO tomo · tomo slice 37/74.0]
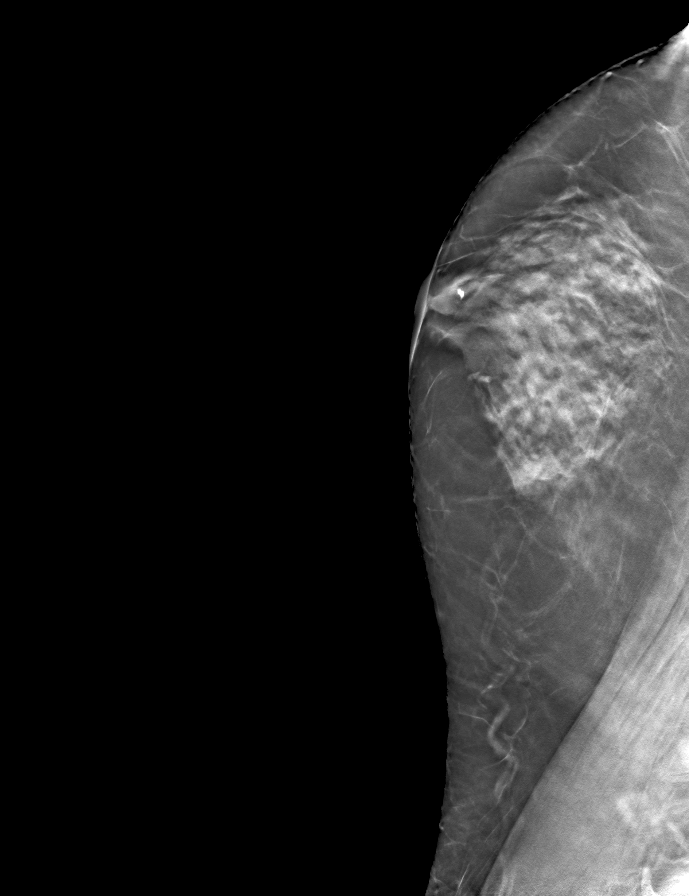

[R MLO tomo · tomo slice 37/72.0]
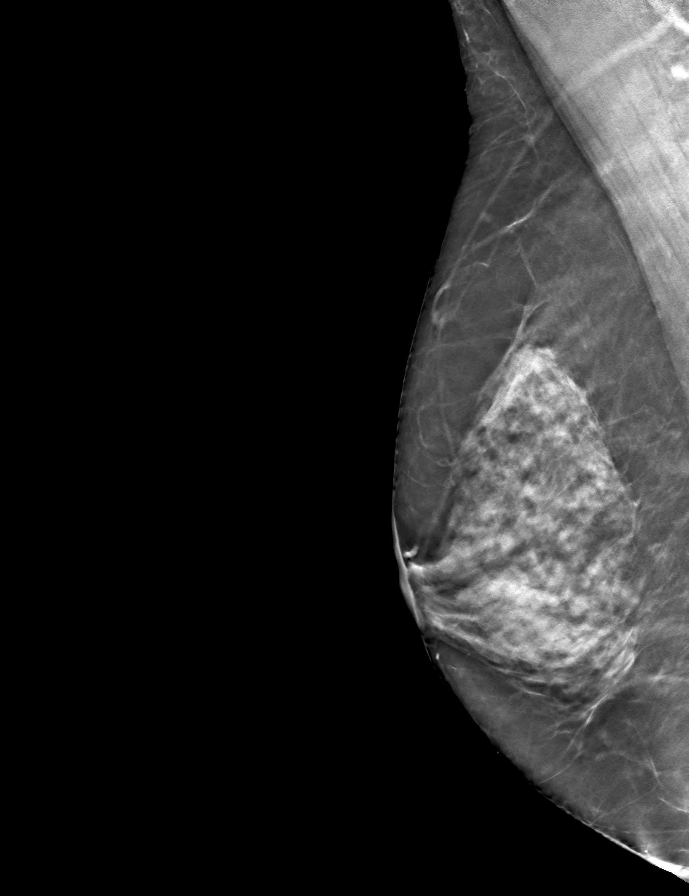

[R CC tomo · tomo slice 37/73.0]
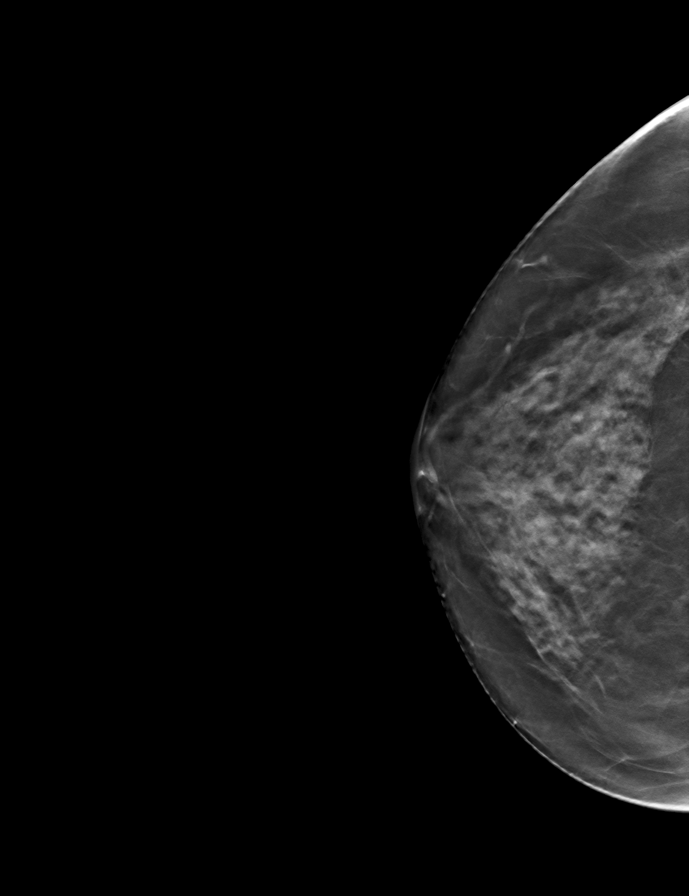

[9 of 24 positions shown; findings below may reference images not displayed]

ACR Breast Density Category c: The breast tissue is heterogeneously
dense, which may obscure small masses.
FINDINGS: There are no findings suspicious for malignancy.
IMPRESSION: No mammographic evidence of malignancy. A result letter of this
screening mammogram will be mailed directly to the patient.

RECOMMENDATION:
Screening mammogram in one year. (Code:Q3-W-BC3)

BI-RADS CATEGORY  1: Negative.

## 2022-12-27 NOTE — Progress Notes (Unsigned)
[facility]  PATIENT: Sonya Joyce DOB: [birth year]  REFERRING DOCTOR OR PCP: Martha Clan, MD SOURCE: Patient, notes with Dr. Malachi Bonds, imaging and lab reports, MRI images personally reviewed.  _________________________________   HISTORICAL  CHIEF COMPLAINT:  No chief complaint on file.   HISTORY OF PRESENT ILLNESS:  I had the pleasure to see your patient, Sonya Joyce, at [facility] for neurologic consultation regarding her right leg weakness.   Imaging: MRI of the brain 10/30/2022 was personally reviewed.  There are mild chronic microvascular ischemic changes in the hemispheres, predominantly in the deep white matter.  These are consistent with mild chronic microvascular ischemic change.  There do not appear to be any acute findings.  Minimal atrophy is normal for age.  REVIEW OF SYSTEMS: Constitutional: No fevers, chills, sweats, or change in appetite Eyes: No visual changes, double vision, eye pain Ear, nose and throat: No hearing loss, ear pain, nasal congestion, sore throat Cardiovascular: No chest pain, palpitations Respiratory:  No shortness of breath at rest or with exertion.   No wheezes GastrointestinaI: No nausea, vomiting, diarrhea, abdominal pain, fecal incontinence Genitourinary:  No dysuria, urinary retention or frequency.  No nocturia. Musculoskeletal:  No neck pain, back pain Integumentary: No rash, pruritus, skin lesions Neurological: as above Psychiatric: No depression at this time.  No anxiety Endocrine: No palpitations, diaphoresis, change in appetite, change in weigh or increased thirst Hematologic/Lymphatic:  No anemia, purpura, petechiae. Allergic/Immunologic: No itchy/runny eyes, nasal congestion, recent allergic reactions, rashes  ALLERGIES: Allergies  Allergen Reactions   Sulfa Antibiotics Rash    HOME MEDICATIONS:  Current Outpatient Medications:    amLODipine (NORVASC) 5 MG tablet, Take 5 mg by  mouth daily., Disp: , Rfl:    escitalopram (LEXAPRO) 20 MG tablet, Take 1 tablet by mouth daily., Disp: , Rfl:    Estradiol (VAGIFEM) 10 MCG TABS vaginal tablet, 1 tablet to vagina two times per week., Disp: 24 tablet, Rfl: 0   olmesartan (BENICAR) 40 MG tablet, TK 1 T PO D, Disp: , Rfl:    rosuvastatin (CRESTOR) 10 MG tablet, , Disp: , Rfl:    Vitamin D, Ergocalciferol, (DRISDOL) 1.25 MG (50000 UNIT) CAPS capsule, Take 1 capsule by mouth once a week., Disp: , Rfl:   PAST MEDICAL HISTORY: Past Medical History:  Diagnosis Date   Atrophic vaginitis    Hypertension    Osteoporosis    hx of fosamax use, on medication holiday   Vitamin D deficiency     PAST SURGICAL HISTORY: Past Surgical History:  Procedure Laterality Date   WISDOM TOOTH EXTRACTION  age 67    FAMILY HISTORY: Family History  Problem Relation Age of Onset   Hepatitis C Mother    Hypertension Mother    Prostate cancer Father 64   Hypertension Father    Multiple births Brother        has a set of twins   Hypertension Brother     SOCIAL HISTORY: Social History   Socioeconomic History   Marital status: Married    Spouse name: Not on file   Number of children: 0   Years of education: Not on file   Highest education level: Not on file  Occupational History    Employer: [employer]  Tobacco Use   Smoking status: Never   Smokeless tobacco: Never  Substance and Sexual Activity   Alcohol use: Yes    Alcohol/week: 5.0 standard drinks of alcohol    Types: 5 Glasses of wine  per week   Drug use: No   Sexual activity: Not Currently    Partners: Male    Birth control/protection: Post-menopausal  Other Topics Concern   Not on file  Social History Narrative   Pt is Conservator, 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   Social Determinants of Health   Financial Resource Strain: Not on file  Food Insecurity: Not on file  Transportation Needs: Not on file  Physical Activity: Not on file  Stress: Not  on file  Social Connections: Not on file  Intimate Partner Violence: Not on file       PHYSICAL EXAM  There were no vitals filed for this visit.  There is no height or weight on file to calculate BMI.   General: The patient is well-developed and well-nourished and in no acute distress  HEENT:  Head is Piatt/AT.  Sclera are anicteric.  Funduscopic exam shows normal optic discs and retinal vessels.  Neck: No carotid bruits are noted.  The neck is nontender.  Cardiovascular: The heart has a regular rate and rhythm with a normal S1 and S2. There were no murmurs, gallops or rubs.    Skin: Extremities are without rash or  edema.  Musculoskeletal:  Back is nontender  Neurologic Exam  Mental status: The patient is alert and oriented x 3 at the time of the examination. The patient has apparent normal recent and remote memory, with an apparently normal attention span and concentration ability.   Speech is normal.  Cranial nerves: Extraocular movements are full. Pupils are equal, round, and reactive to light and accomodation.  Visual fields are full.  Facial symmetry is present. There is good facial sensation to soft touch bilaterally.Facial strength is normal.  Trapezius and sternocleidomastoid strength is normal. No dysarthria is noted.  The tongue is midline, and the patient has symmetric elevation of the soft palate. No obvious hearing deficits are noted.  Motor:  Muscle bulk is normal.   Tone is normal. Strength is  5 / 5 in all 4 extremities.   Sensory: Sensory testing is intact to pinprick, soft touch and vibration sensation in all 4 extremities.  Coordination: Cerebellar testing reveals good finger-nose-finger and heel-to-shin bilaterally.  Gait and station: Station is normal.   Gait is normal. Tandem gait is normal. Romberg is negative.   Reflexes: Deep tendon reflexes are symmetric and normal bilaterally.   Plantar responses are flexor.    DIAGNOSTIC DATA (LABS, IMAGING,  TESTING) - I reviewed patient records, labs, notes, testing and imaging myself where available.  Lab Results  Component Value Date   WBC 4.0 09/25/2010   HGB 12.9 09/25/2010   HCT 38.0 09/25/2010   MCV 80.3 09/25/2010   PLT 194 09/25/2010      Component Value Date/Time   NA 141 09/25/2010 0750   K 3.6 09/25/2010 0750   CL 103 09/25/2010 0750   GLUCOSE 94 09/25/2010 0750   BUN 13 09/25/2010 0750   CREATININE 0.9 09/25/2010 0750   No results found for: "CHOL", "HDL", "LDLCALC", "LDLDIRECT", "TRIG", "CHOLHDL" No results found for: "HGBA1C" No results found for: "VITAMINB12" No results found for: "TSH"     ASSESSMENT AND PLAN  ***   Concepcion Gillott A. Epimenio Foot, MD, [facility] 12/27/2022, 3:36 PM Certified in Neurology, Clinical Neurophysiology, Sleep Medicine and Neuroimaging  [facility] [address], [address] [place], [place] [address] [phone]

## 2022-12-29 ENCOUNTER — Ambulatory Visit (INDEPENDENT_AMBULATORY_CARE_PROVIDER_SITE_OTHER): Payer: Medicare Other | Admitting: Neurology

## 2022-12-29 ENCOUNTER — Encounter: Payer: Self-pay | Admitting: Neurology

## 2022-12-29 ENCOUNTER — Telehealth: Payer: Self-pay | Admitting: Neurology

## 2022-12-29 VITALS — BP 120/69 | HR 63 | Ht 63.0 in | Wt 127.0 lb

## 2022-12-29 DIAGNOSIS — R29898 Other symptoms and signs involving the musculoskeletal system: Secondary | ICD-10-CM | POA: Diagnosis not present

## 2022-12-29 DIAGNOSIS — R200 Anesthesia of skin: Secondary | ICD-10-CM | POA: Diagnosis not present

## 2022-12-29 DIAGNOSIS — R202 Paresthesia of skin: Secondary | ICD-10-CM | POA: Diagnosis not present

## 2022-12-29 NOTE — Telephone Encounter (Signed)
medicare no auth required sent to [facility] [phone]

## 2023-01-11 ENCOUNTER — Ambulatory Visit (HOSPITAL_BASED_OUTPATIENT_CLINIC_OR_DEPARTMENT_OTHER): Payer: Medicare Other

## 2023-01-12 ENCOUNTER — Telehealth (INDEPENDENT_AMBULATORY_CARE_PROVIDER_SITE_OTHER): Payer: Medicare Other | Admitting: Obstetrics & Gynecology

## 2023-01-12 DIAGNOSIS — N952 Postmenopausal atrophic vaginitis: Secondary | ICD-10-CM | POA: Diagnosis not present

## 2023-01-12 DIAGNOSIS — Z87440 Personal history of urinary (tract) infections: Secondary | ICD-10-CM

## 2023-01-12 MED ORDER — ESTRADIOL 10 MCG VA TABS: ORAL_TABLET | VAGINAL | 3 refills | Status: DC

## 2023-01-12 NOTE — Progress Notes (Unsigned)
Virtual Visit via Video Note  I connected with Sonya Joyce on 01/12/23 at  4:15 PM EDT by a video enabled telemedicine application and verified that I am speaking with the correct person using two identifiers.  Location: Patient: home Provider: office   I discussed the limitations of evaluation and management by telemedicine and the availability of in person appointments. The patient expressed understanding and agreed to proceed.  History of Present Illness:  67 yo G0 MWF with vaginal atrophy who is using vagifem.  Continues to desire using as this has really helped her hx of UTI.  She has not had any UTI symptoms in the past year.  Denies vaginal bleeding.  MMG from 08/2022.  Colonoscopy 2017, follow up 10 years is due.  She is having bone density testing with Dr. Clelia Croft.     Observations/Objective: WNWD WF NAD  Assessment and Plan: 1. Post-menopausal atrophic vaginitis - Estradiol (VAGIFEM) 10 MCG TABS vaginal tablet; 1 tablet to vagina two times per week.  Dispense: 24 tablet; Refill: 3  2. History of recurrent UTIs   Follow Up Instructions: I discussed the assessment and treatment plan with the patient. The patient was provided an opportunity to ask questions and all were answered. The patient agreed with the plan and demonstrated an understanding of the instructions.   The patient was advised to call back or seek an in-person evaluation if the symptoms worsen or if the condition fails to improve as anticipated.  I provided 10 minutes of non-face-to-face time during this encounter.   Jerene Bears, MD

## 2023-01-13 ENCOUNTER — Encounter (HOSPITAL_BASED_OUTPATIENT_CLINIC_OR_DEPARTMENT_OTHER): Payer: Self-pay | Admitting: Obstetrics & Gynecology

## 2023-01-20 ENCOUNTER — Ambulatory Visit (HOSPITAL_COMMUNITY)
Admission: RE | Admit: 2023-01-20 | Discharge: 2023-01-20 | Disposition: A | Discharge status: Home / Self Care | Payer: Medicare Other | Source: Ambulatory Visit | Attending: Neurology | Admitting: Neurology

## 2023-01-20 DIAGNOSIS — R200 Anesthesia of skin: Secondary | ICD-10-CM | POA: Insufficient documentation

## 2023-01-20 DIAGNOSIS — R202 Paresthesia of skin: Secondary | ICD-10-CM | POA: Diagnosis present

## 2023-01-20 DIAGNOSIS — R29898 Other symptoms and signs involving the musculoskeletal system: Secondary | ICD-10-CM | POA: Diagnosis present

## 2023-02-04 ENCOUNTER — Ambulatory Visit: Payer: Medicare Other | Admitting: Neurology

## 2023-02-25 ENCOUNTER — Ambulatory Visit (INDEPENDENT_AMBULATORY_CARE_PROVIDER_SITE_OTHER): Payer: Self-pay | Admitting: Neurology

## 2023-02-25 ENCOUNTER — Ambulatory Visit (INDEPENDENT_AMBULATORY_CARE_PROVIDER_SITE_OTHER): Payer: Medicare Other | Admitting: Neurology

## 2023-02-25 VITALS — BP 118/66 | HR 62 | Ht 63.0 in

## 2023-02-25 DIAGNOSIS — R29898 Other symptoms and signs involving the musculoskeletal system: Secondary | ICD-10-CM

## 2023-02-25 DIAGNOSIS — R200 Anesthesia of skin: Secondary | ICD-10-CM

## 2023-02-25 DIAGNOSIS — Z0289 Encounter for other administrative examinations: Secondary | ICD-10-CM

## 2023-02-25 DIAGNOSIS — R202 Paresthesia of skin: Secondary | ICD-10-CM

## 2023-02-25 NOTE — Procedures (Signed)
Full Name: Noha Shipps Gender: Female MRN #: [id number] Date of Birth: 1955-09-23    Visit Date: 02/25/2023 08:26 Age: 67 Years Examining Physician: Dr. Levert Feinstein Referring Physician: Dr. Despina Arias Height: 5 feet 3 inch History: 67 year old female complains of intermittent right leg buckled underneath her  Summary of the tests:  Nerve conduction study:  Right sural, superficial peroneal sensory responses were normal.  Right peroneal to EDB tibial motor responses were normal.  Bilateral tibial H-reflexes were normal and symmetric.  Right median, ulnar sensory and motor responses were normal.  Electromyography: Selected needle examinations of right  lower extremity muscles and right lumbosacral paraspinal muscles were normal.   Conclusion: This is a normal study.  There is no electrodiagnostic evidence of right upper or lower extremity focal neuropathy, there is no evidence of right lumbosacral radiculopathy.    ------------------------------- Levert Feinstein M.D. Ph.D.  [facility] [address] [place], [place] [address] Tel: [phone] Fax: [phone]  Verbal informed consent was obtained from the patient, patient was informed of potential risk of procedure, including bruising, bleeding, hematoma formation, infection, muscle weakness, muscle pain, numbness, among others.        MNC    Nerve / Sites Muscle Latency Ref. Amplitude Ref. Rel Amp Segments Distance Velocity Ref. Area    ms ms mV mV %  cm m/s m/s mVms  R Median - APB     Wrist APB 3.7 ?4.4 4.3 ?4.0 100 Wrist - APB 7   13.7     Upper arm APB 7.8  4.3  102 Upper arm - Wrist 22.6 55 ?49 14.0  R Ulnar - ADM     Wrist ADM 2.5 ?3.3 8.0 ?6.0 100 Wrist - ADM 7   28.6     B.Elbow ADM 4.3  8.5  106 B.Elbow - Wrist 11 62 ?49 27.1     A.Elbow ADM 7.0  8.5  99.6 A.Elbow - B.Elbow 15 55 ?49 27.1  R Peroneal - EDB     Ankle EDB 4.7 ?6.5 5.3 ?2.0 100 Ankle - EDB 9   18.5     Fib  head EDB 9.9  3.7  71.2 Fib head - Ankle 25 48 ?44 13.8     Pop fossa EDB 12.1  5.3  142 Pop fossa - Fib head 10 46 ?44 20.8         Pop fossa - Ankle      R Tibial - AH     Ankle AH 4.1 ?5.8 12.1 ?4.0 100 Ankle - AH 9   28.3     Pop fossa AH 11.8  6.7  55.5 Pop fossa - Ankle 34.6 45 ?41 16.3             SNC    Nerve / Sites Rec. Site Peak Lat Ref.  Amp Ref. Segments Distance Peak Diff Ref.    ms ms V V  cm ms ms  R Sural - Ankle (Calf)     Calf Ankle 3.4 ?4.4 16 ?6 Calf - Ankle 14    R Superficial peroneal - Ankle     Lat leg Ankle 3.8 ?4.4 13 ?6 Lat leg - Ankle 14    R Median, Ulnar - Transcarpal comparison     Median Palm Wrist 2.6 ?2.2 21 ?35 Median Palm - Wrist 8       Ulnar Palm Wrist 2.2 ?2.2 26 ?12 Ulnar Palm - Wrist 8  Median Palm - Ulnar Palm  0.4 ?0.4  R Median - Orthodromic (Dig II, Mid palm)     Dig II Wrist 3.9 ?3.4 9 ?10 Dig II - Wrist 13    R Ulnar - Orthodromic, (Dig V, Mid palm)     Dig V Wrist 3.1 ?3.1 9 ?5 Dig V - Wrist 37                 F  Wave    Nerve F Lat Ref.   ms ms  R Tibial - AH 47.7 ?56.0  R Ulnar - ADM 25.1 ?32.0         H Reflex    Nerve H Lat Lat Hmax   ms ms   Left Right Ref. Left Right Ref.  Tibial - Soleus 38.1 38.8 ?35.0 31.3 30.1 ?35.0         EMG Summary Table    Spontaneous MUAP Recruitment  Muscle IA Fib PSW Fasc Other Amp Dur. Poly Pattern  R. Tibialis anterior Normal None None None _______ Normal Normal Normal Normal  R. Tibialis posterior Normal None None None _______ Normal Normal Normal Normal  R. Peroneus longus Normal None None None _______ Normal Normal Normal Normal  R. Gastrocnemius (Medial head) Normal None None None _______ Normal Normal Normal Normal  R. Vastus lateralis Normal None None None _______ Normal Normal Normal Normal  R. Lumbar paraspinals (low) Normal None None None _______ Normal Normal Normal Normal  R. Lumbar paraspinals (mid) Normal None None None _______ Normal Normal Normal Normal

## 2023-02-25 NOTE — Progress Notes (Signed)
ASSESSMENT AND PLAN  Sonya Joyce is a 67 y.o. female   Intermittent right leg weakness. Intermittent bilateral hip pain  Normal neurological examination, EMG nerve conduction study  No neurological etiology found for her complains    DIAGNOSTIC DATA (LABS, IMAGING, TESTING) - I reviewed patient records, labs, notes, testing and imaging myself where available.   MEDICAL HISTORY:  Sonya Joyce, is a 67 year old female seen in request by Dr. Epimenio Foot for electrodiagnostic study to evaluate her intermittent right leg buckle, her primary care physician is Martha Clan   I reviewed and summarized the referring note. Hypertension,  hyperlipidemia  Since January 2024, she noticed intermittent right leg buckled underneath her, can happen walking on flat ground, most noticeable when going down steps, she denies significant low back pain, denies radiating pain, denies sensory loss, no bowel and bladder incontinence  EMG nerve conduction study today is normal, no evidence of right lower extremity focal neuropathy or right lumbosacral radiculopathy  Personally reviewed MRI of the brain from April 2024, mild small vessel disease no acute abnormality  MRI cervical spine July 2024, degenerative changes most noticeable on  C6-7, with moderate left foraminal narrowing; C5-6, moderate right mild left foraminal narrowing, there was no canal stenosis  PHYSICAL EXAM:  118/66, HR 62.  PHYSICAL EXAMNIATION:  Gen: NAD, conversant, well nourised, well groomed                     Cardiovascular: Regular rate rhythm, no peripheral edema, warm, nontender. Eyes: Conjunctivae clear without exudates or hemorrhage Neck: Supple, no carotid bruits. Pulmonary: Clear to auscultation bilaterally   NEUROLOGICAL EXAM:  MENTAL STATUS: Speech/cognition: Awake, alert, oriented to history taking and casual conversation CRANIAL NERVES: CN II: Visual fields are full to confrontation. Pupils are round  equal and briskly reactive to light. CN III, IV, VI: extraocular movement are normal. No ptosis. CN V: Facial sensation is intact to light touch CN VII: Face is symmetric with normal eye closure  CN VIII: Hearing is normal to causal conversation. CN IX, X: Phonation is normal. CN XI: Head turning and shoulder shrug are intact  MOTOR: There is no pronator drift of out-stretched arms. Muscle bulk and tone are normal. Muscle strength is normal.  REFLEXES: Reflexes are 2+ and symmetric at the biceps, triceps, knees, and ankles. Plantar responses are flexor.  SENSORY: Intact to light touch, pinprick and vibratory sensation are intact in fingers and toes.  COORDINATION: There is no trunk or limb dysmetria noted.  GAIT/STANCE: Posture is normal. Gait is steady with normal steps, base, arm swing, and turning. Heel and toe walking are normal. Tandem gait is normal.  Romberg is absent.  REVIEW OF SYSTEMS:  Full 14 system review of systems performed and notable only for as above All other review of systems were negative.   ALLERGIES: Allergies  Allergen Reactions   Sulfa Antibiotics Rash    HOME MEDICATIONS: Current Outpatient Medications  Medication Sig Dispense Refill   amLODipine (NORVASC) 5 MG tablet Take 5 mg by mouth daily.     escitalopram (LEXAPRO) 20 MG tablet Take 1 tablet by mouth daily.     Estradiol (VAGIFEM) 10 MCG TABS vaginal tablet 1 tablet to vagina two times per week. 24 tablet 3   olmesartan (BENICAR) 40 MG tablet TK 1 T PO D     rosuvastatin (CRESTOR) 10 MG tablet      No current facility-administered medications for this visit.  PAST MEDICAL HISTORY: Past Medical History:  Diagnosis Date   Atrophic vaginitis    Hypertension    Osteoporosis    hx of fosamax use, on medication holiday   Vitamin D deficiency     PAST SURGICAL HISTORY: Past Surgical History:  Procedure Laterality Date   WISDOM TOOTH EXTRACTION  age 15    FAMILY HISTORY: Family  History  Problem Relation Age of Onset   Hepatitis C Mother    Hypertension Mother    Prostate cancer Father 52   Hypertension Father    Multiple births Brother        has a Web designer of twins   Hypertension Brother     SOCIAL HISTORY: Social History   Socioeconomic History   Marital status: Married    Spouse name: Not on file   Number of children: 0   Years of education: Not on file   Highest education level: Not on file  Occupational History    Employer: [employer]  Tobacco Use   Smoking status: Never   Smokeless tobacco: Never  Substance and Sexual Activity   Alcohol use: Yes    Alcohol/week: 5.0 standard drinks of alcohol    Types: 5 Glasses of wine per week   Drug use: No   Sexual activity: Not Currently    Partners: Male    Birth control/protection: Post-menopausal  Other Topics Concern   Not on file  Social History Narrative   Pt is Conservator, 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   Social Determinants of Health   Financial Resource Strain: Not on file  Food Insecurity: Not on file  Transportation Needs: Not on file  Physical Activity: Not on file  Stress: Not on file  Social Connections: Not on file  Intimate Partner Violence: Not on file      Levert Feinstein, M.D. Ph.D.  [facility] [address], [address] [place], [place] [address] Ph: [phone] Fax: [phone]  CC:  Cleatis Polka., MD [address] [place],  [place] [address]  Cleatis Polka., MD

## 2023-03-16 ENCOUNTER — Other Ambulatory Visit: Payer: Self-pay

## 2023-03-16 ENCOUNTER — Ambulatory Visit (INDEPENDENT_AMBULATORY_CARE_PROVIDER_SITE_OTHER): Payer: Medicare Other | Admitting: Orthopaedic Surgery

## 2023-03-16 DIAGNOSIS — M79651 Pain in right thigh: Secondary | ICD-10-CM | POA: Diagnosis not present

## 2023-03-16 NOTE — Progress Notes (Signed)
The patient is a very pleasant and active 67 year old female who is sent to me to evaluate and treat right lower extremity weakness.  She has had a full workup from neurology and has had nerve conduction studies that showed no nerve impingement.  She is active.  She does a lot of walking on uneven surfaces and at times feels like her legs going to give out.  She then gets an electrical shock type of sensation in her thigh.  She denies any weakness otherwise.  She stays active.  Again she has had a full workup including nerve conduction studies.  She has had cervical spine and brain MRIs.  She denies any back pain.  She denies any numbness and tingling in her feet.  On exam she does have a slight Trendelenburg gait on that right side.  Otherwise her exam is entirely normal.  There is no muscle atrophy.  I cannot palpate any masses.  Her knee exam and hip exam appear stable.  She has excellent strength and normal sensation in her bilateral feet and ankles.  Again her knees are ligamentously stable as well especially with the right knee.  Given her Trendelenburg gait, I would like to obtain an MRI of her right hip and her right thigh given the radicular pain that she is having.  This does not seem to be a spine issue at all but we need to at least at this point evaluate for source of her Trendelenburg gait and weakness given her continued symptoms after 6 months of failed conservative treatment.  Her primary care physician Dr. Clelia Croft has sent her our way and I think it is reasonable this point to look at other potentials for her leg giving out on her on the right side.  We will see her back once we have the studies.  She agrees with the treatment plan.  All questions and concerns were addressed and answered.

## 2023-04-09 ENCOUNTER — Ambulatory Visit
Admission: RE | Admit: 2023-04-09 | Discharge: 2023-04-09 | Disposition: A | Discharge status: Home / Self Care | Payer: Medicare Other | Source: Ambulatory Visit | Attending: Orthopaedic Surgery | Admitting: Orthopaedic Surgery

## 2023-04-09 DIAGNOSIS — M79651 Pain in right thigh: Secondary | ICD-10-CM

## 2023-04-23 ENCOUNTER — Encounter: Payer: Self-pay | Admitting: Orthopaedic Surgery

## 2023-04-23 ENCOUNTER — Ambulatory Visit: Payer: Medicare Other | Admitting: Orthopaedic Surgery

## 2023-04-23 ENCOUNTER — Ambulatory Visit: Payer: Medicare Other | Admitting: Sports Medicine

## 2023-04-23 ENCOUNTER — Other Ambulatory Visit: Payer: Self-pay

## 2023-04-23 DIAGNOSIS — M1611 Unilateral primary osteoarthritis, right hip: Secondary | ICD-10-CM

## 2023-04-23 DIAGNOSIS — M25551 Pain in right hip: Secondary | ICD-10-CM

## 2023-04-23 DIAGNOSIS — M79651 Pain in right thigh: Secondary | ICD-10-CM | POA: Diagnosis not present

## 2023-04-23 MED ORDER — LIDOCAINE HCL 1 % IJ SOLN
4.0000 mL | INTRAMUSCULAR | Status: AC | PRN
  Administered 2023-04-23: 4 mL

## 2023-04-23 MED ORDER — METHYLPREDNISOLONE ACETATE 40 MG/ML IJ SUSP
80.0000 mg | INTRAMUSCULAR | Status: AC | PRN
  Administered 2023-04-23: 80 mg via INTRA_ARTICULAR

## 2023-04-23 NOTE — Progress Notes (Signed)
The patient is a very thin and athletic 67 year old female who appears way younger than her stated age.  She was sent to me due to right hip and thigh pain with most of the pain being in her thigh.  Her x-rays did not show anything worrisome so I did send her for a MRI of her right hip and her right.  She is here for review of this today.  She says it is still slowing her down in terms of the pain mainly with just weightbearing and specially when she is exercising.  On exam she is a thin individual.  I can easily put her hip through internal extra rotation with some pain in the groin but not really severe.  She still is having the thigh pain as well.    The MRI of her right hip and right thigh reviewed with her.  There is no worrisome features of the thigh and femur MRI but her right hip MRI does show significant arthritic changes in that hip.  I can see edema in the acetabulum.  There are some areas of cartilage loss in the femoral head.  There is an area of the stress response on the superior lateral femoral neck almost consistent with a stress type of fracture on the tension side of the femoral neck.  This does not extend into the neck at all.  We went over her MRI findings and she looked at the studies as well.  I would actually like to send her to Dr. Shon Baton for a one-time intra-articular steroid injection in that right hip to see how she responds to this from a therapeutic and diagnostic standpoint of things.  I would then see her back in 2 weeks for repeat exam and clinical assessment of her signs and symptoms.  She agrees with this treatment plan.  For now I would like her to avoid high impact aerobic activities until her next follow-up.

## 2023-04-23 NOTE — Progress Notes (Signed)
   Procedure Note  Patient: Sonya Joyce             Date of Birth: 10-Apr-1956           MRN: [id number]             Visit Date: 04/23/2023  Procedures: Visit Diagnoses:  1. Pain of right hip   2. Unilateral primary osteoarthritis, right hip    Large Joint Inj: R hip joint on 04/23/2023 3:35 PM Indications: pain Details: 22 G 3.5 in needle, ultrasound-guided anterior approach Medications: 4 mL lidocaine 1 %; 80 mg methylPREDNISolone acetate 40 MG/ML Outcome: tolerated well, no immediate complications  Procedure: US-guided intra-articular hip injection, right After discussion on risks/benefits/indications and informed verbal consent was obtained, a timeout was performed. Patient was lying supine on exam table. The hip was cleaned with betadine and alcohol swabs. Then utilizing ultrasound guidance, the patient's femoral head and neck junction was identified and subsequently injected with 4:2 lidocaine:depomedrol via an in-plane approach with ultrasound visualization of the injectate administered into the hip joint. Patient tolerated procedure well without immediate complications.  Procedure, treatment alternatives, risks and benefits explained, specific risks discussed. Consent was given by the patient. Immediately prior to procedure a time out was called to verify the correct patient, procedure, equipment, support staff and site/side marked as required. Patient was prepped and draped in the usual sterile fashion.     - patient tolerated injection well without immediate adverse effects - follow-up with Dr. Magnus Ivan as indicated in 2 weeks; I am happy to see them as needed  Madelyn Brunner, DO Primary Care Sports Medicine Physician  [facility]  This note was dictated using Dragon naturally speaking software and may contain errors in syntax, spelling, or content which have not been identified prior to signing this note.

## 2023-05-13 ENCOUNTER — Ambulatory Visit (INDEPENDENT_AMBULATORY_CARE_PROVIDER_SITE_OTHER): Payer: Medicare Other | Admitting: Orthopaedic Surgery

## 2023-05-13 ENCOUNTER — Encounter: Payer: Self-pay | Admitting: Orthopaedic Surgery

## 2023-05-13 DIAGNOSIS — M25551 Pain in right hip: Secondary | ICD-10-CM | POA: Diagnosis not present

## 2023-05-13 DIAGNOSIS — M1611 Unilateral primary osteoarthritis, right hip: Secondary | ICD-10-CM

## 2023-05-13 NOTE — Progress Notes (Signed)
The patient is an active and young appearing 67 year old female who returns in follow-up after having a steroid injection under ultrasound in her right hip joint by Dr. Shon Baton.  She does have an moderate hip osteoarthritis seen on MRI of that hip.  She said the injection was really helpful and is about 95% effective.  She does have some left hip pain.  The MRI of her right hip showed no significant changes in the left hip.  Some of this may be compensating type of pain.  The MRI did show moderate to high-grade L4-L5 and L5-S1 degenerative disc disease and that certainly can be affecting her as well.  On exam both her hips move smoothly and fluidly and there is really no pain today at all.  We talked in length in detail about her exercise routine and her walking.  She does wear supportive shoes.  She is not overweight.  Certainly hip abduction strengthening exercises and activity modification can help.  If things worsen at all she will let us know.  Right now follow-up can be as needed since she is doing so well.  All questions and concerns were addressed and answered.

## 2023-07-16 ENCOUNTER — Encounter: Payer: Self-pay | Admitting: Orthopaedic Surgery

## 2023-09-18 ENCOUNTER — Other Ambulatory Visit (HOSPITAL_BASED_OUTPATIENT_CLINIC_OR_DEPARTMENT_OTHER): Payer: Self-pay | Admitting: Internal Medicine

## 2023-09-18 DIAGNOSIS — Z139 Encounter for screening, unspecified: Secondary | ICD-10-CM

## 2023-09-21 ENCOUNTER — Encounter (HOSPITAL_BASED_OUTPATIENT_CLINIC_OR_DEPARTMENT_OTHER): Payer: Self-pay

## 2023-09-21 ENCOUNTER — Ambulatory Visit (HOSPITAL_BASED_OUTPATIENT_CLINIC_OR_DEPARTMENT_OTHER)
Admission: RE | Admit: 2023-09-21 | Discharge: 2023-09-21 | Disposition: A | Discharge status: Home / Self Care | Source: Ambulatory Visit | Attending: Internal Medicine | Admitting: Internal Medicine

## 2023-09-21 DIAGNOSIS — Z139 Encounter for screening, unspecified: Secondary | ICD-10-CM

## 2023-09-21 DIAGNOSIS — Z1231 Encounter for screening mammogram for malignant neoplasm of breast: Secondary | ICD-10-CM | POA: Diagnosis present

## 2024-04-01 ENCOUNTER — Other Ambulatory Visit (HOSPITAL_COMMUNITY)
Admission: RE | Admit: 2024-04-01 | Discharge: 2024-04-01 | Disposition: A | Discharge status: Home / Self Care | Source: Ambulatory Visit | Attending: Obstetrics & Gynecology | Admitting: Obstetrics & Gynecology

## 2024-04-01 ENCOUNTER — Encounter (HOSPITAL_BASED_OUTPATIENT_CLINIC_OR_DEPARTMENT_OTHER): Payer: Self-pay | Admitting: Obstetrics & Gynecology

## 2024-04-01 ENCOUNTER — Ambulatory Visit (INDEPENDENT_AMBULATORY_CARE_PROVIDER_SITE_OTHER): Admitting: Obstetrics & Gynecology

## 2024-04-01 VITALS — BP 126/74 | HR 66 | Ht 63.0 in | Wt 124.0 lb

## 2024-04-01 DIAGNOSIS — Z87440 Personal history of urinary (tract) infections: Secondary | ICD-10-CM | POA: Diagnosis not present

## 2024-04-01 DIAGNOSIS — N952 Postmenopausal atrophic vaginitis: Secondary | ICD-10-CM

## 2024-04-01 DIAGNOSIS — N9089 Other specified noninflammatory disorders of vulva and perineum: Secondary | ICD-10-CM | POA: Diagnosis not present

## 2024-04-01 DIAGNOSIS — Z01411 Encounter for gynecological examination (general) (routine) with abnormal findings: Secondary | ICD-10-CM | POA: Diagnosis not present

## 2024-04-01 DIAGNOSIS — I10 Essential (primary) hypertension: Secondary | ICD-10-CM | POA: Diagnosis not present

## 2024-04-01 DIAGNOSIS — M8580 Other specified disorders of bone density and structure, unspecified site: Secondary | ICD-10-CM

## 2024-04-01 MED ORDER — ESTRADIOL 10 MCG VA TABS: ORAL_TABLET | VAGINAL | 3 refills | Status: DC

## 2024-04-01 NOTE — Progress Notes (Addendum)
 Breast and Pelvic Exam Patient name: Sonya Joyce MRN [id number]  Date of birth: March 31, 1956 Chief Complaint:   Breast and Pelvic  History of Present Illness:   Sonya Joyce is a 68 y.o. G0P0000 Caucasian female being seen today for breast and pelvic exam.  Denies vaginal bleeding.  Has been having a lot of itching that is external.  Exercises regularly and does feel like irritation is worse in the summer.  Did use some of her husband's cream for jock itch and this helped some.  Still bothering though especially after wiping with toilet paper.    Patient's last menstrual period was 09/04/2005 (approximate).   Last pap 11/06/2021. Results were: NILM w/ HRHPV not done. H/O abnormal pap: no Last mammogram: 09/21/2023. Results were: normal. Family h/o breast cancer: no Last colonoscopy: Pt reports that her last colonoscopy was in 2017 and results were: normal. Family h/o colorectal cancer: no DEXA:  Done with Dr. Loreli     11/06/2021    1:55 PM  Depression screen PHQ 2/9  Decreased Interest 0  Down, Depressed, Hopeless 0  PHQ - 2 Score 0    Review of Systems:   Pertinent items are noted in HPI Denies any bowel or bladder changes.  Denies pelvic pain. Pertinent History Reviewed:  Reviewed past medical,surgical, social and family history.  Reviewed problem list, medications and allergies. Physical Assessment:   Vitals:   04/01/24 1054  BP: 126/74  Pulse: 66  SpO2: 99%  Weight: 124 lb (56.2 kg)  Height: 5' 3 (1.6 m)  Body mass index is 21.97 kg/m.        Physical Examination:   General appearance - well appearing, and in no distress  Mental status - alert, oriented to person, place, and time  Psych:  She has a normal mood and affect  Skin - warm and dry, normal color, no suspicious lesions noted  Chest - effort normal, all lung fields clear to auscultation bilaterally  Heart - normal rate and regular rhythm  Neck:  midline trachea, no thyromegaly or  nodules  Breasts - breasts appear normal, no suspicious masses, no skin or nipple changes or  axillary nodes  Abdomen - soft, nontender, nondistended, no masses or organomegaly  Pelvic - VULVA: no discrete lesions but area of skin thickening noted in location in drawing below (which matches exactly where she is experiencing irritation/itching)   VAGINA: normal appearing vagina with normal color and discharge, no lesions   CERVIX: normal appearing cervix without discharge or lesions, no CMT  Thin prep pap is not indicated today  UTERUS: uterus is felt to be normal size, shape, consistency and nontender   ADNEXA: No adnexal masses or tenderness noted.  Rectal - normal rectal, good sphincter tone, no masses felt.   Extremities:  No swelling or varicosities noted Physical Exam Genitourinary:      Chaperone present for exam  No results found for this or any previous visit (from the past 24 hours).  Assessment & Plan:  1. Encntr for gyn exam (general) (routine) w abnormal findings (Primary) - Pap smear not indicated today - Mammogram 09/21/2023 - Colonoscopy 2017 - Bone mineral density 2025 with Dr. Loreli - lab work done with PCP, Dr. Loreli - vaccines reviewed/updated.  Updated immunizations from [facility] records from [facility].  2. Post-menopausal atrophic vaginitis - Estradiol  (VAGIFEM ) 10 MCG TABS vaginal tablet; 1 tablet to vagina two times per week.  Dispense: 24 tablet; Refill: 3  3. History of  recurrent UTIs  4. Vulvar irritation - will test for yeast/BV.  If negative, will stop topical steroid.  Possible irritants discussed as well.  Pt ok to wait for test results before starting any medicaiton - Cervicovaginal ancillary only( [place])  5. Primary hypertension  6.  Osteopenia - last BMD earlier this year with Dr. Orlando office.  Reviewed in CareEverywhere notes.     No orders of the defined types were placed in this encounter.   Meds:  Meds ordered  this encounter  Medications  . Estradiol  (VAGIFEM ) 10 MCG TABS vaginal tablet    Sig: 1 tablet to vagina two times per week.    Dispense:  24 tablet    Refill:  3    Follow-up: No follow-ups on file.  Ronal GORMAN Pinal, MD 04/04/2024 6:22 AM

## 2024-04-04 ENCOUNTER — Encounter (HOSPITAL_BASED_OUTPATIENT_CLINIC_OR_DEPARTMENT_OTHER): Payer: Self-pay | Admitting: Obstetrics & Gynecology

## 2024-04-04 LAB — CERVICOVAGINAL ANCILLARY ONLY
Bacterial Vaginitis (gardnerella): NEGATIVE
Candida Glabrata: NEGATIVE
Candida Vaginitis: NEGATIVE
Comment: NEGATIVE
Comment: NEGATIVE
Comment: NEGATIVE

## 2024-04-06 ENCOUNTER — Ambulatory Visit (HOSPITAL_BASED_OUTPATIENT_CLINIC_OR_DEPARTMENT_OTHER): Payer: Self-pay | Admitting: Obstetrics & Gynecology

## 2024-04-06 MED ORDER — CLOBETASOL PROPIONATE 0.05 % EX OINT: 1.0000 | TOPICAL_OINTMENT | Freq: Two times a day (BID) | CUTANEOUS | 0 refills | Status: DC

## 2024-04-15 ENCOUNTER — Encounter (HOSPITAL_BASED_OUTPATIENT_CLINIC_OR_DEPARTMENT_OTHER): Payer: Self-pay | Admitting: Obstetrics & Gynecology

## 2024-04-15 ENCOUNTER — Other Ambulatory Visit (HOSPITAL_BASED_OUTPATIENT_CLINIC_OR_DEPARTMENT_OTHER): Payer: Self-pay

## 2024-04-15 DIAGNOSIS — N952 Postmenopausal atrophic vaginitis: Secondary | ICD-10-CM

## 2024-04-15 MED ORDER — CLOBETASOL PROPIONATE 0.05 % EX OINT: 1.0000 | TOPICAL_OINTMENT | Freq: Two times a day (BID) | CUTANEOUS | 0 refills | Status: AC

## 2024-04-15 MED ORDER — ESTRADIOL 10 MCG VA TABS: ORAL_TABLET | VAGINAL | 3 refills | Status: AC

## 2024-05-02 ENCOUNTER — Encounter: Payer: Self-pay | Admitting: Orthopaedic Surgery

## 2024-05-09 ENCOUNTER — Encounter: Payer: Self-pay | Admitting: Radiology

## 2024-05-10 ENCOUNTER — Ambulatory Visit (HOSPITAL_BASED_OUTPATIENT_CLINIC_OR_DEPARTMENT_OTHER): Admitting: Obstetrics & Gynecology

## 2024-05-16 ENCOUNTER — Encounter: Payer: Self-pay | Admitting: Physician Assistant

## 2024-05-16 ENCOUNTER — Ambulatory Visit (INDEPENDENT_AMBULATORY_CARE_PROVIDER_SITE_OTHER): Admitting: Physician Assistant

## 2024-05-16 ENCOUNTER — Other Ambulatory Visit (INDEPENDENT_AMBULATORY_CARE_PROVIDER_SITE_OTHER): Payer: Self-pay

## 2024-05-16 ENCOUNTER — Other Ambulatory Visit: Payer: Self-pay

## 2024-05-16 DIAGNOSIS — M25532 Pain in left wrist: Secondary | ICD-10-CM

## 2024-05-16 DIAGNOSIS — M25552 Pain in left hip: Secondary | ICD-10-CM

## 2024-05-16 MED ORDER — LIDOCAINE HCL 1 % IJ SOLN
3.0000 mL | INTRAMUSCULAR | Status: AC | PRN
  Administered 2024-05-16: 3 mL

## 2024-05-16 MED ORDER — METHYLPREDNISOLONE ACETATE 40 MG/ML IJ SUSP
40.0000 mg | INTRAMUSCULAR | Status: AC | PRN
  Administered 2024-05-16: 40 mg via INTRA_ARTICULAR

## 2024-05-16 NOTE — Progress Notes (Signed)
 Office Visit Note   Patient: Sonya Joyce           Date of Birth: May 08, 1956           MRN: [id number] Visit Date: 05/16/2024              Requested by: Sonya Joyce., MD [address] [place],  [place] [address] PCP: Sonya Joyce., MD   Assessment & Plan: Visit Diagnoses:  1. Pain in left wrist   2. Pain in left hip     Plan: Recommend the use of Voltaren gel over the wrist 2 g 4 times daily.  In regards to the hip recommended left hip trochanteric injection she was agreeable.  Patient tolerated the injection well today.  She shown IT band stretching exercises.  Regards to the right hip at this point time is not bothering her to the point that she wants to consider an intra-articular injection.  She can always call our office if she would like to proceed with this in the near future.  Otherwise she will follow-up as needed.  Questions were encouraged and answered.  Follow-Up Instructions: No follow-ups on file.   Orders:  Orders Placed This Encounter  Procedures   Large Joint Inj   XR Wrist Complete Left   XR HIP UNILAT W OR W/O PELVIS 2-3 VIEWS LEFT   No orders of the defined types were placed in this encounter.     Procedures: Large Joint Inj: L greater trochanter on 05/16/2024 10:31 AM Indications: pain Details: 22 G 1.5 in needle, lateral approach  Arthrogram: No  Medications: 3 mL lidocaine  1 %; 40 mg methylPREDNISolone  acetate 40 MG/ML Outcome: tolerated well, no immediate complications Procedure, treatment alternatives, risks and benefits explained, specific risks discussed. Consent was given by the patient. Immediately prior to procedure a time out was called to verify the correct patient, procedure, equipment, support staff and site/side marked as required. Patient was prepped and draped in the usual sterile fashion.       Clinical Data: No additional findings.   Subjective: Chief Complaint  Patient presents with   Left Wrist - Pain    Left Hip - Pain    HPI Sonya Joyce 68 year old female well-known to Sonya Joyce service comes in today with left wrist and left hip pain.  She states that the left wrist pain began about a month ago some swelling and achiness.  She has had no injury.  She states that she is has some minor discomfort achiness lateral aspect of the wrist but is not having the symptoms today.  States that this achy sensation is sporadic in occurrence. Left hip pain for 6 months lateral aspect particularly at night, we will to get comfortable due to pain lateral aspect of the left hip.  No groin pain.  Does awaken her.  She denies any numbness tingling down the leg.  She does have some right thigh pain and has had prior right hip intra-articular injection and has moderate right hip arthritis by MRI.  Patient is nondiabetic.   Review of Systems  Constitutional:  Negative for chills and fever.     Objective: Vital Signs: LMP 09/04/2005 (Approximate)   Physical Exam Constitutional:      Appearance: She is normal weight. She is not ill-appearing or diaphoretic.  Pulmonary:     Effort: Pulmonary effort is normal.  Neurological:     Mental Status: She is alert and oriented to person, place, and time.  Psychiatric:        Mood and Affect: Mood normal.     Ortho Exam Bilateral hips: Good range of motion without pain.  Tenderness over left hip trochanteric region only. Left wrist full range of motion without pain.  Prominence over the left styloid region compared to the right.  No ecchymosis erythema of either wrist.  Left wrist ulnar and radial deviation causes no discomfort.  There is no tenderness over the distal left ulna. Specialty Comments:  No specialty comments available.  Imaging: XR HIP UNILAT W OR W/O PELVIS 2-3 VIEWS LEFT Result Date: 05/16/2024 AP pelvis lateral view left hip: Bilateral hips well located.  Hip joints overall well-preserved.  No acute fractures acute findings.  AP view pelvis shows  degenerative changes at L4-5 and L5-S1.  XR Wrist Complete Left Result Date: 05/16/2024 Left wrist 3 views: Wrist joint overall well-maintained.  Calcification of the TFCC seen.  Age undetermined but chronic distal ulna styloid avulsion.  Otherwise no acute fractures findings.    PMFS History: Patient Active Problem List   Diagnosis Date Noted   Unilateral primary osteoarthritis, right hip 04/23/2023   Vaginal atrophy 11/08/2021   History of recurrent UTIs 11/08/2021   Hypertension 11/08/2021   Elevated lipids 11/08/2021   Past Medical History:  Diagnosis Date   Atrophic vaginitis    Hypertension    Osteoporosis    hx of fosamax use, on medication holiday   Vitamin D  deficiency     Family History  Problem Relation Age of Onset   Hepatitis C Mother    Hypertension Mother    Prostate cancer Father 56   Hypertension Father    Multiple births Brother        has a set of twins   Hypertension Brother     Past Surgical History:  Procedure Laterality Date   WISDOM TOOTH EXTRACTION  age 3   Social History   Occupational History    Employer: [employer]  Tobacco Use   Smoking status: Never   Smokeless tobacco: Never  Substance and Sexual Activity   Alcohol use: Yes    Alcohol/week: 5.0 standard drinks of alcohol    Types: 5 Glasses of wine per week   Drug use: No   Sexual activity: Not Currently    Partners: Male    Birth control/protection: Post-menopausal
